# Patient Record
Sex: Male | Born: 1965 | Race: White | Hispanic: No | Marital: Married | Smoking: Former smoker
Health system: Southern US, Community
[De-identification: ages and names within clinical notes are randomized; demographics above are authoritative.]

## PROBLEM LIST (undated history)

## (undated) DIAGNOSIS — I10 Essential (primary) hypertension: Secondary | ICD-10-CM

## (undated) DIAGNOSIS — C801 Malignant (primary) neoplasm, unspecified: Secondary | ICD-10-CM

## (undated) DIAGNOSIS — E119 Type 2 diabetes mellitus without complications: Secondary | ICD-10-CM

## (undated) DIAGNOSIS — M1990 Unspecified osteoarthritis, unspecified site: Secondary | ICD-10-CM

## (undated) DIAGNOSIS — E785 Hyperlipidemia, unspecified: Secondary | ICD-10-CM

## (undated) DIAGNOSIS — M109 Gout, unspecified: Secondary | ICD-10-CM

## (undated) DIAGNOSIS — S82899A Other fracture of unspecified lower leg, initial encounter for closed fracture: Secondary | ICD-10-CM

## (undated) DIAGNOSIS — N189 Chronic kidney disease, unspecified: Secondary | ICD-10-CM

## (undated) DIAGNOSIS — I4891 Unspecified atrial fibrillation: Secondary | ICD-10-CM

## (undated) DIAGNOSIS — I499 Cardiac arrhythmia, unspecified: Secondary | ICD-10-CM

## (undated) HISTORY — DX: Gout, unspecified: M10.9

## (undated) HISTORY — DX: Essential (primary) hypertension: I10

## (undated) HISTORY — DX: Malignant (primary) neoplasm, unspecified: C80.1

## (undated) HISTORY — DX: Hyperlipidemia, unspecified: E78.5

## (undated) HISTORY — DX: Chronic kidney disease, unspecified: N18.9

## (undated) HISTORY — DX: Type 2 diabetes mellitus without complications: E11.9

## (undated) HISTORY — DX: Other fracture of unspecified lower leg, initial encounter for closed fracture: S82.899A

---

## 1994-02-11 HISTORY — PX: ANKLE SURGERY: SHX546

## 2006-03-19 ENCOUNTER — Emergency Department: Payer: Self-pay | Admitting: Emergency Medicine

## 2006-09-16 ENCOUNTER — Ambulatory Visit: Payer: Self-pay | Admitting: Internal Medicine

## 2009-08-06 ENCOUNTER — Emergency Department (HOSPITAL_COMMUNITY): Admission: EM | Admit: 2009-08-06 | Discharge: 2009-08-06 | Payer: Self-pay | Admitting: Emergency Medicine

## 2009-08-06 ENCOUNTER — Emergency Department: Payer: Self-pay | Admitting: Emergency Medicine

## 2011-10-16 ENCOUNTER — Ambulatory Visit: Payer: Self-pay | Admitting: Internal Medicine

## 2011-11-12 ENCOUNTER — Ambulatory Visit: Payer: Self-pay | Admitting: Internal Medicine

## 2012-01-17 ENCOUNTER — Telehealth: Payer: Self-pay

## 2013-01-27 NOTE — Telephone Encounter (Signed)
Error

## 2013-11-20 DIAGNOSIS — E782 Mixed hyperlipidemia: Secondary | ICD-10-CM | POA: Insufficient documentation

## 2013-11-20 DIAGNOSIS — IMO0002 Reserved for concepts with insufficient information to code with codable children: Secondary | ICD-10-CM | POA: Insufficient documentation

## 2013-11-20 DIAGNOSIS — I10 Essential (primary) hypertension: Secondary | ICD-10-CM | POA: Insufficient documentation

## 2013-11-20 DIAGNOSIS — M810 Age-related osteoporosis without current pathological fracture: Secondary | ICD-10-CM | POA: Insufficient documentation

## 2013-11-20 DIAGNOSIS — E119 Type 2 diabetes mellitus without complications: Secondary | ICD-10-CM | POA: Insufficient documentation

## 2014-02-16 ENCOUNTER — Ambulatory Visit: Payer: Self-pay | Admitting: Internal Medicine

## 2014-03-01 ENCOUNTER — Ambulatory Visit: Payer: Self-pay | Admitting: Internal Medicine

## 2014-03-11 ENCOUNTER — Ambulatory Visit: Payer: Self-pay | Admitting: Hematology and Oncology

## 2014-03-11 LAB — COMPREHENSIVE METABOLIC PANEL
Albumin: 3.7 g/dL (ref 3.4–5.0)
Alkaline Phosphatase: 113 U/L (ref 46–116)
Anion Gap: 8 (ref 7–16)
BUN: 9 mg/dL (ref 7–18)
Bilirubin,Total: 0.3 mg/dL (ref 0.2–1.0)
Calcium, Total: 9.2 mg/dL (ref 8.5–10.1)
Chloride: 103 mmol/L (ref 98–107)
Co2: 25 mmol/L (ref 21–32)
Creatinine: 0.99 mg/dL (ref 0.60–1.30)
EGFR (African American): >60
EGFR (Non-African Amer.): >60
Glucose: 259 mg/dL — ABNORMAL HIGH (ref 65–99)
Osmolality: 280 (ref 275–301)
Potassium: 4.2 mmol/L (ref 3.5–5.1)
SGOT(AST): 22 U/L (ref 15–37)
SGPT (ALT): 35 U/L (ref 14–63)
Sodium: 136 mmol/L (ref 136–145)
Total Protein: 7.6 g/dL (ref 6.4–8.2)

## 2014-03-11 LAB — CBC CANCER CENTER
Basophil #: 0.1 x10 3/mm (ref 0.0–0.1)
Basophil %: 0.9 %
Eosinophil #: 0.1 x10 3/mm (ref 0.0–0.7)
Eosinophil %: 2.3 %
HCT: 46 % (ref 40.0–52.0)
HGB: 15.6 g/dL (ref 13.0–18.0)
Lymphocyte #: 2 x10 3/mm (ref 1.0–3.6)
Lymphocyte %: 32.6 %
MCH: 30.8 pg (ref 26.0–34.0)
MCHC: 34 g/dL (ref 32.0–36.0)
MCV: 91 fL (ref 80–100)
Monocyte #: 0.6 x10 3/mm (ref 0.2–1.0)
Monocyte %: 9.3 %
Neutrophil #: 3.3 x10 3/mm (ref 1.4–6.5)
Neutrophil %: 54.9 %
Platelet: 267 x10 3/mm (ref 150–440)
RBC: 5.08 10*6/uL (ref 4.40–5.90)
RDW: 13.1 % (ref 11.5–14.5)
WBC: 6.1 x10 3/mm (ref 3.8–10.6)

## 2014-03-11 LAB — URIC ACID: Uric Acid: 5.1 mg/dL (ref 3.5–7.2)

## 2014-03-11 LAB — LACTATE DEHYDROGENASE: LDH: 147 U/L (ref 85–241)

## 2014-03-14 ENCOUNTER — Encounter: Payer: Self-pay | Admitting: General Surgery

## 2014-03-14 ENCOUNTER — Ambulatory Visit: Payer: Self-pay | Admitting: Hematology and Oncology

## 2014-03-14 ENCOUNTER — Ambulatory Visit: Payer: Self-pay | Admitting: Internal Medicine

## 2014-03-14 ENCOUNTER — Other Ambulatory Visit: Payer: Self-pay | Admitting: General Surgery

## 2014-03-14 ENCOUNTER — Ambulatory Visit (INDEPENDENT_AMBULATORY_CARE_PROVIDER_SITE_OTHER): Payer: 59 | Admitting: General Surgery

## 2014-03-14 VITALS — BP 142/70 | HR 82 | Resp 16 | Ht 74.0 in | Wt 384.0 lb

## 2014-03-14 DIAGNOSIS — R599 Enlarged lymph nodes, unspecified: Secondary | ICD-10-CM

## 2014-03-14 DIAGNOSIS — R590 Localized enlarged lymph nodes: Secondary | ICD-10-CM

## 2014-03-14 NOTE — Patient Instructions (Addendum)
Patient's surgery has been scheduled for 03-15-14 at [facility].

## 2014-03-14 NOTE — Progress Notes (Signed)
Patient ID: Colton Choi, male   DOB: 08-12-1965, 49 y.o.   MRN: [id number]  Chief Complaint  Patient presents with  . Other    swollen left inguinal lymphnode    HPI Colton Choi is a 49 y.o. male  Here for evaluation of swollen left inguinal lymph node. He recently had a left leg ultrasound on 02/17/14 due to right foot swelling, the swollen lymph node was found then. He had a CT Scan 03/01/14 of inguinal area. He reports no pain or discomfort.  He initially came to medical intention when he reported swelling on the dorsum of the distal left foot. This prompted the ultrasound and subsequent CT scan.  The patient has been seen by [name], M.D. and medical oncology and referred here for consideration of biopsy.  The patient is a Magazine features editor. He reports no unusual physical activity or injury.  In summer 2015 he had a 3 or 4 cm area of skin erythema and induration with an irregular rash that based on [name] M.D. thought was ringworm. This responded to topical therapy and has completely resolved with no residual nodularity in minimal skin scarring.  The patient is accompanied today by his sister was present for the interview and post exam consultation. HPI  Past Medical History  Diagnosis Date  . Hypertension   . Diabetes mellitus without complication   . Broken ankle 20 years ago    Right    No past surgical history on file.  No family history on file.  Social History History  Substance Use Topics  . Smoking status: Former Smoker -- 1.00 packs/day for 15 years    Types: Cigarettes    Quit date: 02/12/2012  . Smokeless tobacco: Never Used  . Alcohol Use: 0.0 oz/week    0 Not specified per week    Allergies  Allergen Reactions  . Sulfa Antibiotics     Current Outpatient Prescriptions  Medication Sig Dispense Refill  . amLODipine (NORVASC) 5 MG tablet Take 5 mg by mouth daily.    [name] aspirin 81 MG tablet Take 81 mg by mouth daily.    [name]  lisinopril-hydrochlorothiazide (PRINZIDE,ZESTORETIC) 20-12.5 MG per tablet Take 2 tablets by mouth daily.    . metFORMIN (GLUCOPHAGE) 500 MG tablet Take 500 mg by mouth 4 (four) times daily.    . pioglitazone (ACTOS) 30 MG tablet Take 30 mg by mouth daily.     No current facility-administered medications for this visit.    Review of Systems Review of Systems  Constitutional: Negative.   Respiratory: Negative.   Cardiovascular: Positive for leg swelling (left foot x 2 months). Negative for chest pain and palpitations.    Blood pressure 142/70, pulse 82, resp. rate 16, height 6\' 2"  (1.88 m), weight 384 lb (174.181 kg).  Physical Exam Physical Exam  Constitutional: He is oriented to person, place, and time. He appears well-developed and well-nourished.  Neck: Neck supple.  Cardiovascular: Normal rate, regular rhythm and normal heart sounds.   Pulses:      Femoral pulses are 2+ on the right side, and 2+ on the left side.      Dorsalis pedis pulses are 2+ on the right side, and 2+ on the left side.       Posterior tibial pulses are 2+ on the right side, and 2+ on the left side.  Pulmonary/Chest: Effort normal and breath sounds normal.  Musculoskeletal:       Legs:      Feet:  Lymphadenopathy:    He has no cervical adenopathy.    He has no axillary adenopathy.       Right: No inguinal adenopathy present.       Left: Inguinal adenopathy present.  Neurological: He is alert and oriented to person, place, and time.  Skin:     Inner triginous rash    Data Reviewed Abdominal ultrasound of 02/16/2014 as well as CT scan of the abdomen and pelvis dated 03/01/2014 were reviewed. No evidence of DVT. Small less than 1.5 Center lymph nodes in the right inguinal area, 3.9 cm lymph node in left inguinal area.  I spoke with Dr. Mike Gip by phone regarding biopsy options.  Assessment    Left inguinal lymphadenopathy, possibly related to summer time ringworm infection versus intertriginous  rash versus lymphoma.    Plan    The patient's sister is visiting from [place], [place], and it seems as to make use of her presence by doing an open biopsy tomorrow to establish a diagnosis.   Patient's surgery has been scheduled for 03-15-14 at [facility]. It is okay for patient to continue 81 mg aspirin.     Ref: Dr [name] PCP: Dr Governor Specking  [name] 03/14/2014, 1:40 PM

## 2014-03-15 ENCOUNTER — Ambulatory Visit: Payer: Self-pay | Admitting: General Surgery

## 2014-03-15 DIAGNOSIS — R590 Localized enlarged lymph nodes: Secondary | ICD-10-CM

## 2014-03-15 HISTORY — PX: OTHER SURGICAL HISTORY: SHX169

## 2014-03-16 ENCOUNTER — Encounter: Payer: Self-pay | Admitting: General Surgery

## 2014-03-17 ENCOUNTER — Encounter: Payer: Self-pay | Admitting: General Surgery

## 2014-03-22 ENCOUNTER — Ambulatory Visit (INDEPENDENT_AMBULATORY_CARE_PROVIDER_SITE_OTHER): Payer: Self-pay | Admitting: General Surgery

## 2014-03-22 ENCOUNTER — Encounter: Payer: Self-pay | Admitting: General Surgery

## 2014-03-22 VITALS — BP 152/82 | HR 88 | Resp 14 | Ht 74.0 in | Wt 381.0 lb

## 2014-03-22 DIAGNOSIS — R590 Localized enlarged lymph nodes: Secondary | ICD-10-CM

## 2014-03-22 DIAGNOSIS — R599 Enlarged lymph nodes, unspecified: Secondary | ICD-10-CM

## 2014-03-22 NOTE — Progress Notes (Signed)
Patient ID: Colton Choi, male   DOB: [birth year], 49 y.o.   MRN: [id number]  Chief Complaint  Patient presents with  . Routine Post Op     left groin lymphnode     HPI [name] is a 49 y.o. male here today for his post op left inguinal lymph node excision done on 03/15/14.Patient states he is doing well just still a little "sore". He reports mild numbness below the incision.  Here today with his sister.  HPI  Past Medical History  Diagnosis Date  . Hypertension   . Diabetes mellitus without complication   . Broken ankle 20 years ago    Right    Past Surgical History  Procedure Laterality Date  . Left grion excision  03/15/14    No family history on file.  Social History History  Substance Use Topics  . Smoking status: Former Smoker -- 1.00 packs/day for 15 years    Types: Cigarettes    Quit date: 02/12/2012  . Smokeless tobacco: Never Used  . Alcohol Use: 0.0 oz/week    0 Standard drinks or equivalent per week    Allergies  Allergen Reactions  . Sulfa Antibiotics     Current Outpatient Prescriptions  Medication Sig Dispense Refill  . amLODipine (NORVASC) 5 MG tablet Take 5 mg by mouth daily.    [name] aspirin 81 MG tablet Take 81 mg by mouth daily.    [name] ibuprofen (ADVIL,MOTRIN) 200 MG tablet Take 200 mg by mouth every 6 (six) hours as needed.    [name] lisinopril-hydrochlorothiazide (PRINZIDE,ZESTORETIC) 20-12.5 MG per tablet Take 2 tablets by mouth daily.    . metFORMIN (GLUCOPHAGE) 500 MG tablet Take 500 mg by mouth 4 (four) times daily.    . pioglitazone (ACTOS) 30 MG tablet Take 30 mg by mouth daily.     No current facility-administered medications for this visit.    Review of Systems Review of Systems  Constitutional: Negative.   Respiratory: Negative.   Cardiovascular: Negative.     Blood pressure 152/82, pulse 88, resp. rate 14, height 6\' 2"  (1.88 m), weight 381 lb (172.82 kg).  Physical Exam Physical Exam  Constitutional: He is oriented to person,  place, and time. He appears well-developed and well-nourished.  Lymphadenopathy:  Left groin incision healing well.  Neurological: He is alert and oriented to person, place, and time.  Skin: Skin is warm and dry.    Data Reviewed Flow cytometry was negative. Final pathology is pending.  Assessment    Doing well post lymph node excision.    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 We'll plan for return to work next week after his follow-up visit with Dr. Mike Gip. He will be contacted when pathology is available.     Follow up based on pathology.  May return to work Wednesday. Appointment to see Dr. Patsy Baltimore on Tuesday. May use heating pad as needed for comfort.  Patient is scheduled to see Dr Patsy Baltimore on Tuesday at 10:00 am.  PCP:  Frazier Richards Dr. Sunday Shams, Forest Gleason 03/23/2014, 4:10 PM

## 2014-03-22 NOTE — Patient Instructions (Addendum)
The patient is aware to call back for any questions or concerns. May use heating pad as needed for comfort 

## 2014-03-25 ENCOUNTER — Encounter: Payer: Self-pay | Admitting: General Surgery

## 2014-04-05 ENCOUNTER — Ambulatory Visit: Payer: Self-pay | Admitting: Hematology and Oncology

## 2014-04-07 ENCOUNTER — Ambulatory Visit: Payer: Self-pay | Admitting: Hematology and Oncology

## 2014-04-12 ENCOUNTER — Ambulatory Visit
Admit: 2014-04-12 | Disposition: A | Discharge status: Home / Self Care | Payer: Self-pay | Attending: Internal Medicine | Admitting: Internal Medicine

## 2014-04-12 ENCOUNTER — Ambulatory Visit
Admit: 2014-04-12 | Disposition: A | Discharge status: Home / Self Care | Payer: Self-pay | Attending: Hematology and Oncology | Admitting: Hematology and Oncology

## 2014-04-21 ENCOUNTER — Telehealth: Payer: Self-pay

## 2014-04-21 NOTE — Telephone Encounter (Addendum)
Pt came in to drop off  his AVS from his other doctor office for Dr Brigitte Pulse. 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 The forms were placed in nurses box at TL station. He can be reached @ [phone]. Thank you

## 2014-04-21 NOTE — Telephone Encounter (Signed)
Placed in Dr. Raul Del box.

## 2014-04-22 NOTE — Telephone Encounter (Signed)
Will have to review papers IA chart

## 2014-05-03 NOTE — Telephone Encounter (Signed)
I think this has already been addressed when pt presented to clinic with a copy of his PCPs note but please pull paper IA chart to confirm that it was documented that pt did NOT have OSA or need testing and that his card was reissued/lengthened.  Nothing scanned into Epic yet.

## 2014-05-03 NOTE — Telephone Encounter (Signed)
Note from PCP received - Dr. Caleen Essex III - stating that pt does not have sleep apnea and does not need a sleep study as he is asymptomatic regardless of his large neck size.

## 2014-05-04 DIAGNOSIS — C8100 Nodular lymphocyte predominant Hodgkin lymphoma, unspecified site: Secondary | ICD-10-CM | POA: Insufficient documentation

## 2014-05-13 ENCOUNTER — Ambulatory Visit
Admit: 2014-05-13 | Disposition: A | Discharge status: Home / Self Care | Payer: Self-pay | Attending: Internal Medicine | Admitting: Internal Medicine

## 2014-05-13 ENCOUNTER — Ambulatory Visit
Admit: 2014-05-13 | Disposition: A | Discharge status: Home / Self Care | Payer: Self-pay | Attending: Hematology and Oncology | Admitting: Hematology and Oncology

## 2014-06-06 ENCOUNTER — Other Ambulatory Visit: Payer: Self-pay | Admitting: *Deleted

## 2014-06-06 LAB — CBC CANCER CENTER
Basophil #: 0 x10 3/mm (ref 0.0–0.1)
Basophil %: 0.5 %
Eosinophil #: 0.3 x10 3/mm (ref 0.0–0.7)
Eosinophil %: 6.8 %
HCT: 41.9 % (ref 40.0–52.0)
HGB: 14.3 g/dL (ref 13.0–18.0)
Lymphocyte #: 0.5 x10 3/mm — ABNORMAL LOW (ref 1.0–3.6)
Lymphocyte %: 13.4 %
MCH: 31.2 pg (ref 26.0–34.0)
MCHC: 34.2 g/dL (ref 32.0–36.0)
MCV: 91 fL (ref 80–100)
Monocyte #: 0.4 x10 3/mm (ref 0.2–1.0)
Monocyte %: 10.4 %
Neutrophil #: 2.7 x10 3/mm (ref 1.4–6.5)
Neutrophil %: 68.9 %
Platelet: 171 x10 3/mm (ref 150–440)
RBC: 4.6 10*6/uL (ref 4.40–5.90)
RDW: 13.2 % (ref 11.5–14.5)
WBC: 4 x10 3/mm (ref 3.8–10.6)

## 2014-06-06 LAB — SURGICAL PATHOLOGY

## 2014-06-07 ENCOUNTER — Other Ambulatory Visit: Payer: Self-pay | Admitting: *Deleted

## 2014-06-10 ENCOUNTER — Ambulatory Visit: Admission: RE | Admit: 2014-06-10 | Payer: 59 | Source: Ambulatory Visit

## 2014-06-12 NOTE — Op Note (Signed)
PATIENT NAME:  Colton Choi, Colton Choi MR#:  [id number] DATE OF BIRTH:  1965/03/03  DATE OF PROCEDURE:  03/15/2014  PREOPERATIVE DIAGNOSIS: Left inguinal lymphadenopathy, possible lymphoma.   POSTOPERATIVE DIAGNOSIS: Left inguinal lymphadenopathy, possible lymphoma.   OPERATIVE PROCEDURE: Excision of left inguinal node.   SURGEON: [name], M.D.    ANESTHESIA: Attended local, 30 mL of 0.5% Xylocaine with 0.25% Marcaine with 1:200,000 units of epinephrine.   ESTIMATED BLOOD LOSS: Minimal.   CLINICAL NOTE: This 49 year old male was noted to have incidentally identified lymphadenopathy on a lower extremity ultrasound. Node biopsy has been requested by medical oncology.   OPERATIVE NOTE: With the patient under adequate sedation, the area was prepped with ChloraPrep. Extensive taping was used to hold the abdominal panniculus away from the surgical field. The area had been marked with ultrasound prior to prepping. Marcaine and Xylocaine were infiltrated for analgesia. A skin line incision just below the inguinal crease was carried down through the skin and subcutaneous tissue and hemostasis was achieved with electrocautery. The nodal tissue was identified and the vascular pedicles divided between 3-0 Vicryl ties. The node was sent fresh for histology. The wound was closed in layers with 2-0 Vicryl deep, 3-0 Vicryl to the superficial fascia and a running 4-0 Vicryl subcuticular suture for the skin. Benzoin, Steri-Strips, Telfa and Tegaderm dressing was then applied.   The patient tolerated the procedure well and was taken to the recovery room in stable condition.    ____________________________ [name], MD jwb:JT D: 03/16/2014 07:26:27 ET T: 03/16/2014 11:06:15 ET JOB#: [id number]  cc: [name], MD, <Dictator> Ocie Cornfield. Ouida Sills, MD Melissa C. Mike Gip, MD Pepe Mineau Amedeo Kinsman MD ELECTRONICALLY SIGNED 03/16/2014 16:08

## 2014-06-12 NOTE — Consult Note (Signed)
Reason for Visit: This 49 year old Male patient presents to the clinic for initial evaluation of  Hodgkin's lymphoma .   Referred by Dr. Mike Gip.  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  Pathology Report pathology report reviewed   Imaging Report PET CT scan reviewed   Referral Report clinical notes reviewed   Planned Treatment Regimen upfront chemotherapy with involved field radiation   HPI   patient's a pleasant 49 year old male who presented with subtle left lower extremity swelling. He was noted to have left inguinal adenopathy confirmed on CT scanand was seen by surgeon who underwent biopsy which was positive for nodular lymphocyte predominant Hodgkin's lymphoma. PET CT scan was performed showing hypermetabolic enlarged pelvic lymph nodes in the region of the left external iliac node as well as right pelvic sidewall.patient has no fever chills or night sweats. Does have some right upper chest itching of the skin at times. Patient is morbidly obese specifically denies weight loss over time. He is seen today for consideration of treatment.  Past Hx:    Gout:    Chronic kidney disease (stage II):    Diabetes:    Diabetes - Borderline:    Hypertension:    Hyperlipidemia:    HTN:    Bone marrow aspirate and biopsy: 08-Apr-2014   Left inguinal lymph node biopsy: 15-Mar-2014   Ankle Surgery  following fracture: 1996  Past, Family and Social History:  Past Medical History positive   Cardiovascular hyperlipidemia; hypertension   Genitourinary stage II chronic renal disease   Endocrine diabetes mellitus   Past Surgical History right ankle surgery with pinning   Past Medical History Comments gout   Family History positive   Family History Comments mother died of cardiac vascular  heart disease had breast cancer at age 75 grandmother with cervical cancer   Social History positive   Social History Comments 15-pack-year smoking history quit smoking 2 years prior no EtOH abuse history   Additional Past Medical and Surgical History seen by himself today   Allergies:   Sulfa drugs: Unknown  Home Meds:  Home Medications: Medication Instructions Status  hydrochlorothiazide-lisinopril 12.5 mg-20 mg oral tablet 2 tab(s) orally once a day Active  pioglitazone 30 mg oral tablet 1 tab(s) orally once a day Active  metFORMIN 500 mg oral tablet 4  orally once a day Active  aspirin 81 mg oral tablet 1 tab(s) orally once a day Active  amLODIPine 10 mg oral tablet 1 tab(s) orally once a day (in the morning) Active  glipiZIDE 2.5 mg oral tablet, extended release 1 tab(s) orally once a day Active   Review of Systems:  General negative   Performance Status (ECOG) 0   Skin negative   Breast negative   Ophthalmologic negative   ENMT negative   Respiratory and Thorax negative   Cardiovascular negative   Gastrointestinal negative   Genitourinary negative   Musculoskeletal negative   Neurological negative   Psychiatric negative   Hematology/Lymphatics negative   Endocrine negative   Allergic/Immunologic negative   Review of Systems   except for the left lower extremity edemadenies any weight loss, fatigue, weakness, fever, chills or night sweats. Patient denies any loss of vision, blurred vision. Patient denies any ringing  of the ears or hearing loss. No irregular heartbeat. Patient denies heart murmur or history of fainting. Patient denies  any chest pain or pain radiating to her upper extremities. Patient denies any shortness of breath, difficulty breathing at night, cough or hemoptysis. Patient denies any swelling in the lower legs. Patient denies any nausea vomiting, vomiting of blood, or coffee ground material in the vomitus. Patient denies any stomach pain.  Patient states has had normal bowel movements no significant constipation or diarrhea. Patient denies any dysuria, hematuria or significant nocturia. Patient denies any problems walking, swelling in the joints or loss of balance. Patient denies any skin changes, loss of hair or loss of weight. Patient denies any excessive worrying or anxiety or significant depression. Patient denies any problems with insomnia. Patient denies excessive thirst, polyuria, polydipsia. Patient denies any swollen glands, patient denies easy bruising or easy bleeding. Patient denies any recent infections, allergies or URI. Patient "s visual fields have not changed significantly in recent time.   Nursing Notes:  Nursing Vital Signs and Chemo Nursing Nursing Notes: *CC Vital Signs Flowsheet:   09-Mar-16 09:51  Temp Temperature 98.5  Pulse Pulse 80  Respirations Respirations 20  SBP SBP 153  DBP DBP 81  Pain Scale (0-10)  0  Current Weight (kg) (kg) 179.2  Height (cm) centimeters 188.5  BSA (m2) 2.9   Physical Exam:  General/Skin/HEENT:  General normal   Skin normal   Eyes normal   ENMT normal   Head and Neck normal   Additional PE well-developed obese male in NAD. No cervical supraclavicular axillary or inguinal adenopathy is appreciated. He is status post inguinal biopsy which site is healing well. Lungs are clear to A&P cardiac examination shows regular rate and rhythm. Abdomen is obese. He still has some prominent left lower extremity edema.   Breasts/Resp/CV/GI/GU:  Respiratory and Thorax normal   Cardiovascular normal   Gastrointestinal normal   Genitourinary normal   MS/Neuro/Psych/Lymph:  Musculoskeletal normal   Neurological normal   Lymphatics normal   Other Results:  Radiology Results: CT:    19-Jan-16 08:30, CT Abdomen and Pelvis With Contrast  CT Abdomen and Pelvis With Contrast   REASON FOR EXAM:    LABS FIRST Enlarged Lymph Node Groin Area Diabetic   Metformin  COMMENTS:        PROCEDURE: KCT - KCT ABDOMEN/PELVIS W  - Mar 01 2014  8:30AM     CLINICAL DATA:  Left foot swelling since tenth/2015. Diabetes.  Enlarged groin lymph node.    EXAM:  CT ABDOMEN AND PELVIS WITH CONTRAST    TECHNIQUE:  Multidetector CT imaging of the abdomen and pelvis was performed  using the standard protocol following bolus administration of  intravenous contrast.    CONTRAST:  125 cc Omnipaque 350    COMPARISON:  Report from 01/24/2000    FINDINGS:  Lower chest: Mild scarring or subsegmental atelectasis in the  lingula.    Hepatobiliary: Diffuse hepatic steatosis.    Pancreas: Unremarkable    Spleen: Unremarkable  Adrenals/Urinary Tract: Unremarkable    Stomach/Bowel: Unremarkable    Vascular/Lymphatic: Aortoiliac atherosclerotic vascular disease.  Right external iliac lymph node 1.5 cm in short axis, image 80  series 9. Right external iliac lymph node 1.3 cm in short axis,  image 85 series 9. Left external iliac lymph node 1.9 cm in short  axis, image 77 series 9, with a small fatty hilum. Left inguinal  lymph node 1.5 cm in short axis, image 93 series 9. No pathologic  adenopathy in the mesentery, porta hepatis, or para-aortic  positions.    Reproductive: Unremarkable  Other: No supplemental non-categorized findings.    Musculoskeletal: Small umbilical hernia contains adipose tissue.  Degenerative spurring of both femoral heads.     IMPRESSION:  1. Bilateral external iliac and left inguinal adenopathy, mild to  moderate in degree. CT does not provided the specific cause for this  adenopathy, and neoplastic an reactive causes are both possible.  Left inguinal lymph node biopsy should be straightforward, if  clinically indicated, for example if the adenopathy persists.  2. Diffuse hepatic steatosis.  3.  Aortoiliac atherosclerotic vascular disease.  4. Small umbilical hernia contains adipose tissue.  5. Degenerative spurring of both femoral  heads.  Electronically Signed  By: Sherryl Barters M.D.    On: 03/01/2014 09:16         Verified By: Carron Curie, M.D.,  Nuclear Med:    [phone] 08:48, PET/CT Scan Lymphoma Initial Staging  PET/CT Scan Lymphoma Initial Staging   REASON FOR EXAM:    Staging Hodgkins  COMMENTS:       PROCEDURE: PET - PET/CT Eduardo Osier LYMPHOMA  - Apr 05 2014  8:48AM     CLINICAL DATA:  Initial treatment strategy for non-Hodgkin's  lymphoma.    EXAM:  NUCLEAR MEDICINE PET SKULL BASE TO THIGH    TECHNIQUE:  12.2 mCi F-18 FDG was injected intravenously. Full-ring PET imaging  was performed from the skull base to thigh after the radiotracer. CT  data was obtained and used for attenuation correction and anatomic  localization.    FASTING BLOOD GLUCOSE:  Value: 173 mg/dl    COMPARISON:  CT scan abdomen/ pelvis 03/01/2014    FINDINGS:  Examination is limited by morbid obesity. Moderate misregistration  and artifact.    NECK    No hypermetabolic lymph nodes in the neck.    CHEST  No hypermetabolic mediastinal or hilar nodes. No suspicious  pulmonary nodules on the CT scan.    ABDOMEN/PELVIS    No abnormal hypermetabolic activity within the liver, pancreas,  adrenal glands, or spleen. The enlarged pelvic lymph nodes. are  hypermetabolic. The right pelvic sidewall node has SUV max of 4.6.  The left external iliac node has an SUV max of 4.3. The enlarged  left inguinal lymph node has been surgically removed (excisional  biopsy) No enlarged or hypermetabolic abdominal lymph nodes.    SKELETON    No focal hypermetabolic activity to suggest skeletal metastasis.   IMPRESSION:  Limited examination due to morbid obesity.    Hypermetabolic pelvic lymph nodes as discussed above. No adenopathy  in the neck, axilla, chest or abdomen.      Electronically Signed    By: Marijo Sanes M.D.    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 I believe the patient would benefit from systemic chemotherapy. Patient also may benefit from involved field radiation after completion of chemotherapy to his inguinal and pelvic lymph nodes. Case will be presented at our weekly tumor conference. I have discussed the case personally with medical oncology. I will also set a follow-up appointment with medical oncology for next week.  I would like to take this opportunity for allowing me to participate in the care of your patient..  Fax to Physician:  Physicians To Recieve Fax: Kirk Ruths, MD - [phone].  Electronic Signatures: Armstead Peaks (MD)  (Signed 09-Mar-16 11:45)  Authored: HPI, Diagnosis, Past Hx, PFSH, Allergies, Home Meds, ROS, Nursing Notes, Physical Exam, Other Results, Relevent Results, Encounter Assessment and Plan, Fax to Physician   Last Updated: 09-Mar-16 11:45 by Armstead Peaks (MD)

## 2014-06-13 ENCOUNTER — Ambulatory Visit
Admission: RE | Admit: 2014-06-13 | Discharge: 2014-06-13 | Disposition: A | Discharge status: Home / Self Care | Payer: 59 | Source: Ambulatory Visit | Attending: Radiation Oncology | Admitting: Radiation Oncology

## 2014-06-13 ENCOUNTER — Ambulatory Visit: Payer: 59 | Admitting: Radiation Oncology

## 2014-06-13 ENCOUNTER — Other Ambulatory Visit: Payer: Self-pay

## 2014-06-13 DIAGNOSIS — C8105 Nodular lymphocyte predominant Hodgkin lymphoma, lymph nodes of inguinal region and lower limb: Secondary | ICD-10-CM | POA: Insufficient documentation

## 2014-06-13 DIAGNOSIS — Z510 Encounter for antineoplastic radiation therapy: Secondary | ICD-10-CM | POA: Diagnosis not present

## 2014-06-14 ENCOUNTER — Ambulatory Visit
Admission: RE | Admit: 2014-06-14 | Discharge: 2014-06-14 | Disposition: A | Discharge status: Home / Self Care | Payer: 59 | Source: Ambulatory Visit | Attending: Radiation Oncology | Admitting: Radiation Oncology

## 2014-06-14 DIAGNOSIS — C8105 Nodular lymphocyte predominant Hodgkin lymphoma, lymph nodes of inguinal region and lower limb: Secondary | ICD-10-CM | POA: Diagnosis not present

## 2014-06-15 ENCOUNTER — Ambulatory Visit
Admission: RE | Admit: 2014-06-15 | Discharge: 2014-06-15 | Disposition: A | Discharge status: Home / Self Care | Payer: 59 | Source: Ambulatory Visit | Attending: Radiation Oncology | Admitting: Radiation Oncology

## 2014-06-15 DIAGNOSIS — C8105 Nodular lymphocyte predominant Hodgkin lymphoma, lymph nodes of inguinal region and lower limb: Secondary | ICD-10-CM | POA: Diagnosis not present

## 2014-06-16 ENCOUNTER — Ambulatory Visit
Admission: RE | Admit: 2014-06-16 | Discharge: 2014-06-16 | Disposition: A | Discharge status: Home / Self Care | Payer: 59 | Source: Ambulatory Visit | Attending: Radiation Oncology | Admitting: Radiation Oncology

## 2014-06-16 DIAGNOSIS — C8105 Nodular lymphocyte predominant Hodgkin lymphoma, lymph nodes of inguinal region and lower limb: Secondary | ICD-10-CM | POA: Diagnosis not present

## 2014-06-17 ENCOUNTER — Ambulatory Visit
Admission: RE | Admit: 2014-06-17 | Discharge: 2014-06-17 | Disposition: A | Discharge status: Home / Self Care | Payer: 59 | Source: Ambulatory Visit | Attending: Radiation Oncology | Admitting: Radiation Oncology

## 2014-06-17 ENCOUNTER — Other Ambulatory Visit: Payer: Self-pay | Admitting: *Deleted

## 2014-06-17 DIAGNOSIS — C8105 Nodular lymphocyte predominant Hodgkin lymphoma, lymph nodes of inguinal region and lower limb: Secondary | ICD-10-CM | POA: Diagnosis not present

## 2014-06-17 DIAGNOSIS — C8190 Hodgkin lymphoma, unspecified, unspecified site: Secondary | ICD-10-CM

## 2014-06-17 MED ORDER — SILVER SULFADIAZINE 1 % EX CREA: 1.0000 "application " | TOPICAL_CREAM | Freq: Two times a day (BID) | CUTANEOUS | Status: DC

## 2014-06-17 MED ORDER — SILVER SULFADIAZINE 1 % EX CREA: TOPICAL_CREAM | Freq: Two times a day (BID) | CUTANEOUS | Status: DC

## 2014-06-20 ENCOUNTER — Ambulatory Visit
Admission: RE | Admit: 2014-06-20 | Discharge: 2014-06-20 | Disposition: A | Discharge status: Home / Self Care | Payer: 59 | Source: Ambulatory Visit | Attending: Radiation Oncology | Admitting: Radiation Oncology

## 2014-06-20 DIAGNOSIS — C8105 Nodular lymphocyte predominant Hodgkin lymphoma, lymph nodes of inguinal region and lower limb: Secondary | ICD-10-CM | POA: Diagnosis not present

## 2014-07-12 ENCOUNTER — Telehealth: Payer: Self-pay

## 2014-07-21 ENCOUNTER — Other Ambulatory Visit: Payer: Self-pay | Admitting: *Deleted

## 2014-07-21 ENCOUNTER — Encounter: Payer: Self-pay | Admitting: Radiation Oncology

## 2014-07-21 ENCOUNTER — Ambulatory Visit
Admission: RE | Admit: 2014-07-21 | Discharge: 2014-07-21 | Disposition: A | Discharge status: Home / Self Care | Payer: 59 | Source: Ambulatory Visit | Attending: Radiation Oncology | Admitting: Radiation Oncology

## 2014-07-21 VITALS — BP 142/74 | HR 70 | Temp 96.6°F | Resp 20 | Wt >= 6400 oz

## 2014-07-21 DIAGNOSIS — C8190 Hodgkin lymphoma, unspecified, unspecified site: Secondary | ICD-10-CM

## 2014-07-21 DIAGNOSIS — C8590 Non-Hodgkin lymphoma, unspecified, unspecified site: Secondary | ICD-10-CM

## 2014-07-21 NOTE — Progress Notes (Signed)
Radiation Oncology Follow up Note  Name: Colton Choi   Date:   07/21/2014 MRN:  [id number] DOB: 09-12-1965    This 49 y.o. male presents to the clinic today for follow-up of stage IIa lymphocyte predominant Hodgkin's lymphoma status post external beam radiation therapy to his pelvis.  REFERRING PROVIDER: Kirk Ruths, MD  HPI: Patient is a 49 year old male now one month out having completed external beam radiation therapy to his pelvis for stage IIa lymphocyte predominant Hodgkin's disease. He presented with a left inguinal adenopathy biopsy-positive for Hodgkin's lymphoma lymphocyte predominant. PET/CT done demonstrated hypermetabolic enlarged pelvic nodes in the external iliac as well as right pelvic sidewall. He was seen for second opinion at [facility] and recommendation was made for radiation therapy alone.[name] He is now seen in routine follow-up and is doing well he specifically denies diarrhea dysuria or any other GI/GU complaints. The swelling in his left lower extremity which was a presenting sign has improved. He is ambulating well.  COMPLICATIONS OF TREATMENT: none  FOLLOW UP COMPLIANCE: keeps appointments   PHYSICAL EXAM:  BP 142/74 mmHg  Pulse 70  Temp(Src) 96.6 F (35.9 C)  Resp 20  Wt 404 lb 5.2 oz (183.4 kg) Well-developed obese male in NAD. No peripheral adenopathy is noted in the cervical supraclavicular axillary or inguinal regions. Still has some slight lymphedema of his left lower extremity although this is improved. Well-developed well-nourished patient in NAD. HEENT reveals PERLA, EOMI, discs not visualized.  Oral cavity is clear. No oral mucosal lesions are identified. Neck is clear without evidence of cervical or supraclavicular adenopathy. Lungs are clear to A&P. Cardiac examination is essentially unremarkable with regular rate and rhythm without murmur rub or thrill. Abdomen is benign with no organomegaly or masses noted. Motor sensory and DTR levels are  equal and symmetric in the upper and lower extremities. Cranial nerves II through XII are grossly intact. Proprioception is intact. No peripheral adenopathy or edema is identified. No motor or sensory levels are noted. Crude visual fields are within normal range.   RADIOLOGY RESULTS: Follow-up PET CT scan has been ordered  PLAN: At the present time I've ordered a follow-up PET CT scan in about a month's time and is scheduled a follow-up appointment with medical oncology. We'll review the PET CT when it becomes available. He otherwise is doing well. I've asked to see him back in 4-5 months for follow-up. He knows to call sooner with any concerns.  I would like to take this opportunity for allowing me to participate in the care of your patient.Armstead Peaks., MD

## 2014-08-22 ENCOUNTER — Ambulatory Visit
Admission: RE | Admit: 2014-08-22 | Discharge: 2014-08-22 | Disposition: A | Discharge status: Home / Self Care | Payer: 59 | Source: Ambulatory Visit | Attending: Radiation Oncology | Admitting: Radiation Oncology

## 2014-08-22 DIAGNOSIS — C8190 Hodgkin lymphoma, unspecified, unspecified site: Secondary | ICD-10-CM

## 2014-08-22 LAB — GLUCOSE, CAPILLARY: GLUCOSE-CAPILLARY: 107 mg/dL — AB (ref 65–99)

## 2014-08-22 MED ORDER — FLUDEOXYGLUCOSE F - 18 (FDG) INJECTION
12.6000 | Freq: Once | INTRAVENOUS | Status: AC | PRN
  Administered 2014-08-22: 12.6 via INTRAVENOUS

## 2014-08-25 ENCOUNTER — Other Ambulatory Visit: Payer: Self-pay

## 2014-08-25 DIAGNOSIS — R590 Localized enlarged lymph nodes: Secondary | ICD-10-CM

## 2014-08-29 ENCOUNTER — Ambulatory Visit: Payer: 59 | Admitting: Hematology and Oncology

## 2014-08-30 ENCOUNTER — Inpatient Hospital Stay: Payer: 59 | Attending: Hematology and Oncology | Admitting: Hematology and Oncology

## 2014-09-11 ENCOUNTER — Encounter: Payer: Self-pay | Admitting: Hematology and Oncology

## 2014-09-13 ENCOUNTER — Encounter: Payer: Self-pay | Admitting: Hematology and Oncology

## 2014-09-22 ENCOUNTER — Ambulatory Visit: Payer: 59 | Admitting: Hematology and Oncology

## 2014-10-10 ENCOUNTER — Telehealth: Payer: Self-pay | Admitting: *Deleted

## 2014-10-10 ENCOUNTER — Other Ambulatory Visit: Payer: Self-pay

## 2014-10-10 ENCOUNTER — Ambulatory Visit
Admission: RE | Admit: 2014-10-10 | Discharge: 2014-10-10 | Disposition: A | Discharge status: Home / Self Care | Payer: 59 | Source: Ambulatory Visit | Attending: Hematology and Oncology | Admitting: Hematology and Oncology

## 2014-10-10 ENCOUNTER — Inpatient Hospital Stay: Payer: 59 | Attending: Hematology and Oncology | Admitting: Hematology and Oncology

## 2014-10-10 ENCOUNTER — Telehealth: Payer: Self-pay

## 2014-10-10 ENCOUNTER — Inpatient Hospital Stay: Payer: 59

## 2014-10-10 ENCOUNTER — Other Ambulatory Visit: Payer: Self-pay | Admitting: *Deleted

## 2014-10-10 VITALS — BP 135/78 | HR 70 | Temp 97.8°F | Resp 16 | Wt >= 6400 oz

## 2014-10-10 DIAGNOSIS — I10 Essential (primary) hypertension: Secondary | ICD-10-CM | POA: Diagnosis not present

## 2014-10-10 DIAGNOSIS — R590 Localized enlarged lymph nodes: Secondary | ICD-10-CM

## 2014-10-10 DIAGNOSIS — C8100 Nodular lymphocyte predominant Hodgkin lymphoma, unspecified site: Secondary | ICD-10-CM | POA: Insufficient documentation

## 2014-10-10 DIAGNOSIS — M7989 Other specified soft tissue disorders: Secondary | ICD-10-CM | POA: Insufficient documentation

## 2014-10-10 DIAGNOSIS — Z87891 Personal history of nicotine dependence: Secondary | ICD-10-CM | POA: Diagnosis not present

## 2014-10-10 DIAGNOSIS — R197 Diarrhea, unspecified: Secondary | ICD-10-CM | POA: Insufficient documentation

## 2014-10-10 DIAGNOSIS — Z7982 Long term (current) use of aspirin: Secondary | ICD-10-CM | POA: Diagnosis not present

## 2014-10-10 DIAGNOSIS — Z79899 Other long term (current) drug therapy: Secondary | ICD-10-CM | POA: Insufficient documentation

## 2014-10-10 DIAGNOSIS — M25552 Pain in left hip: Secondary | ICD-10-CM | POA: Insufficient documentation

## 2014-10-10 DIAGNOSIS — E119 Type 2 diabetes mellitus without complications: Secondary | ICD-10-CM | POA: Diagnosis not present

## 2014-10-10 DIAGNOSIS — Z8639 Personal history of other endocrine, nutritional and metabolic disease: Secondary | ICD-10-CM | POA: Diagnosis not present

## 2014-10-10 DIAGNOSIS — N189 Chronic kidney disease, unspecified: Secondary | ICD-10-CM | POA: Insufficient documentation

## 2014-10-10 DIAGNOSIS — I129 Hypertensive chronic kidney disease with stage 1 through stage 4 chronic kidney disease, or unspecified chronic kidney disease: Secondary | ICD-10-CM | POA: Insufficient documentation

## 2014-10-10 DIAGNOSIS — E785 Hyperlipidemia, unspecified: Secondary | ICD-10-CM | POA: Diagnosis not present

## 2014-10-10 DIAGNOSIS — Z8781 Personal history of (healed) traumatic fracture: Secondary | ICD-10-CM | POA: Insufficient documentation

## 2014-10-10 DIAGNOSIS — Z923 Personal history of irradiation: Secondary | ICD-10-CM | POA: Insufficient documentation

## 2014-10-10 DIAGNOSIS — R5383 Other fatigue: Secondary | ICD-10-CM | POA: Diagnosis not present

## 2014-10-10 DIAGNOSIS — M25559 Pain in unspecified hip: Secondary | ICD-10-CM | POA: Insufficient documentation

## 2014-10-10 LAB — COMPREHENSIVE METABOLIC PANEL
ALT: 16 U/L — ABNORMAL LOW (ref 17–63)
AST: 21 U/L (ref 15–41)
Albumin: 3.5 g/dL (ref 3.5–5.0)
Alkaline Phosphatase: 72 U/L (ref 38–126)
Anion gap: 8 (ref 5–15)
BUN: 10 mg/dL (ref 6–20)
CO2: 27 mmol/L (ref 22–32)
Calcium: 8.3 mg/dL — ABNORMAL LOW (ref 8.9–10.3)
Chloride: 104 mmol/L (ref 101–111)
Creatinine, Ser: 0.78 mg/dL (ref 0.61–1.24)
GFR calc Af Amer: >60 mL/min (ref >60)
GFR calc non Af Amer: >60 mL/min (ref >60)
Glucose, Bld: 117 mg/dL — ABNORMAL HIGH (ref 65–99)
Potassium: 4.1 mmol/L (ref 3.5–5.1)
Sodium: 139 mmol/L (ref 135–145)
Total Bilirubin: 0.4 mg/dL (ref 0.3–1.2)
Total Protein: 7.1 g/dL (ref 6.5–8.1)

## 2014-10-10 LAB — CBC WITH DIFFERENTIAL/PLATELET
Basophils Absolute: 0 10*3/uL (ref 0–0.1)
Basophils Relative: 1 %
Eosinophils Absolute: 0.1 10*3/uL (ref 0–0.7)
Eosinophils Relative: 3 %
HCT: 39.7 % — ABNORMAL LOW (ref 40.0–52.0)
Hemoglobin: 13.8 g/dL (ref 13.0–18.0)
Lymphocytes Relative: 15 %
Lymphs Abs: 0.5 10*3/uL — ABNORMAL LOW (ref 1.0–3.6)
MCH: 31.7 pg (ref 26.0–34.0)
MCHC: 34.9 g/dL (ref 32.0–36.0)
MCV: 90.9 fL (ref 80.0–100.0)
Monocytes Absolute: 0.6 10*3/uL (ref 0.2–1.0)
Monocytes Relative: 16 %
Neutro Abs: 2.4 10*3/uL (ref 1.4–6.5)
Neutrophils Relative %: 65 %
Platelets: 227 10*3/uL (ref 150–440)
RBC: 4.37 MIL/uL — ABNORMAL LOW (ref 4.40–5.90)
RDW: 13.5 % (ref 11.5–14.5)
WBC: 3.6 10*3/uL — ABNORMAL LOW (ref 3.8–10.6)

## 2014-10-10 LAB — LACTATE DEHYDROGENASE: LDH: 129 U/L (ref 98–192)

## 2014-10-10 LAB — FERRITIN: Ferritin: 245 ng/mL (ref 24–336)

## 2014-10-10 LAB — SEDIMENTATION RATE: Sed Rate: 43 mm/hr — ABNORMAL HIGH (ref 0–15)

## 2014-10-10 LAB — URIC ACID: Uric Acid, Serum: 7.1 mg/dL (ref 4.4–7.6)

## 2014-10-10 NOTE — Progress Notes (Signed)
Pt c/o fatigue, insomnia, and lethargy, along with swelling in his left leg. Also notes diarrhea, (with white "tic tac" shaped balls) since radiation; and that he has been taking 3 to 9 Motrin tablets daily for right lower back and left leg pain.Marland KitchenMarland Kitchen

## 2014-10-10 NOTE — Progress Notes (Signed)
[facility] day:  10/10/2014  Chief Complaint: Colton Choi is a 49 y.o. 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  HPI: The patient was last seen in the medical oncology clinic on 04/25/2014.  At that time, he was seen for reassessment after interval radiation oncology consult.  Given the controversy regardng radiation or chemotherapy, he sought second opinion at [facility].  Decision was made to proceed with radiation.  He received 36 Gy from 05/24/2014 until 06/20/2014 to the whole pelvis.  He tolerated the radiation well.  He had some resolution of the left lower extremity edema.  He had some mild intermittent diarrhea which was well controlled with imodium.  PET scan on 08/22/2014 revealed no evidence of active lymphoma.  Symptomatically, he notes constant fatigue. His left hip began hurting awhile after radiation. He he had some skin erythema post radiation. He notes recurrence of the left lower extremity edema associated with decreased mobility. He has trouble shifting gears on the truck.  He denies any adenopathy. He has had diarrhea since radiation.  Past Medical History  Diagnosis Date  . Hypertension   . Diabetes mellitus without complication   . Broken ankle 20 years ago    Right  . Gout   . Chronic kidney disease   . Hyperlipidemia   . Cancer     lymphoma    Past Surgical History  Procedure Laterality Date  . Left grion excision  03/15/14  . Ankle surgery  1996    No family history on file.  Social History:  reports that he quit smoking about 2 years ago. His smoking use included Cigarettes. He has a 15 pack-year smoking history. He has never used smokeless tobacco. He reports that he drinks alcohol. He reports that he does not use illicit drugs.  The patient is accompanied by his wife today.  Allergies:  Allergies   Allergen Reactions  . Sulfa Antibiotics     Current Medications: Current Outpatient Prescriptions  Medication Sig Dispense Refill  . amLODipine (NORVASC) 10 MG tablet Take by mouth.    [name] aspirin 81 MG tablet Take 81 mg by mouth daily.    [name] glimepiride (AMARYL) 2 MG tablet Take by mouth.    [name] glipiZIDE (GLUCOTROL XL) 2.5 MG 24 hr tablet Take by mouth.    [name] ibuprofen (ADVIL,MOTRIN) 200 MG tablet Take 200 mg by mouth every 6 (six) hours as needed.    [name] lisinopril-hydrochlorothiazide (PRINZIDE,ZESTORETIC) 20-12.5 MG per tablet Take 2 tablets by mouth daily.    . metFORMIN (GLUCOPHAGE) 500 MG tablet Take 500 mg by mouth 4 (four) times daily.    . pioglitazone (ACTOS) 30 MG tablet Take by mouth.    . silver sulfADIAZINE (SILVADENE) 1 % cream Apply 1 application topically 2 (two) times daily. (Patient not taking: Reported on 07/21/2014) 50 g 1   No current facility-administered medications for this visit.    Review of Systems:  GENERAL:  Feels tired.  No fevers, sweats or weight loss. PERFORMANCE STATUS (ECOG):  0-1 HEENT:  No visual changes, runny nose, sore throat, mouth sores or tenderness. Lungs: No shortness of breath or cough.  No hemoptysis. Cardiac:  No chest pain, palpitations, orthopnea, or PND. GI:  Diarrhea since radiation.  No nausea, vomiting, constipation, melena or hematochezia. GU:  No urgency, frequency, dysuria, or hematuria. Musculoskeletal:  No back pain.  Hip pain.  No muscle tenderness. Extremities:  Left lower extremity swelling.  No pain. Skin:  No rashes or skin changes. Neuro:  No headache, numbness or weakness, balance or coordination issues. Endocrine:  No diabetes, thyroid issues, hot flashes or night sweats. Psych:  No mood changes, depression or anxiety. Pain:  No focal pain. Review of systems:  All other systems reviewed and found to be negative.  Physical Exam: Blood pressure 135/78, pulse 70, temperature 97.8 F (36.6 C), temperature source Tympanic,  resp. rate 16, weight 405 lb 13.9 oz (184.101 kg). GENERAL:  Well developed, well nourished, sitting comfortably in the exam room in no acute distress. MENTAL STATUS:  Alert and oriented to person, place and time. HEAD:  Wearing a cap.  Goatee.  Normocephalic, atraumatic, face symmetric, no Cushingoid features. EYES:  Glasses.  Brown eyes.  Pupils equal round and reactive to light and accomodation.  No conjunctivitis or scleral icterus. ENT:  Oropharynx clear without lesion.  Tongue normal. Mucous membranes moist.  RESPIRATORY:  Clear to auscultation without rales, wheezes or rhonchi. CARDIOVASCULAR:  Regular rate and rhythm without murmur, rub or gallop. ABDOMEN:  Soft, non-tender, with active bowel sounds, and no hepatosplenomegaly.  No masses. SKIN:  No rashes, ulcers or lesions. EXTREMITIES: Left lower extremity edema.  No skin discoloration or tenderness.  No palpable cords. LYMPH NODES: No palpable cervical, supraclavicular, axillary or inguinal adenopathy  NEUROLOGICAL: Unremarkable. PSYCH:  Appropriate.  Appointment on 10/10/2014  Component Date Value Ref Range Status  . WBC 10/10/2014 3.6* 3.8 - 10.6 K/uL Final  . RBC 10/10/2014 4.37* 4.40 - 5.90 MIL/uL Final  . Hemoglobin 10/10/2014 13.8  13.0 - 18.0 g/dL Final  . HCT 10/10/2014 39.7* 40.0 - 52.0 % Final  . MCV 10/10/2014 90.9  80.0 - 100.0 fL Final  . MCH 10/10/2014 31.7  26.0 - 34.0 pg Final  . MCHC 10/10/2014 34.9  32.0 - 36.0 g/dL Final  . RDW 10/10/2014 13.5  11.5 - 14.5 % Final  . Platelets 10/10/2014 227  150 - 440 K/uL Final  . Neutrophils Relative % 10/10/2014 65   Final  . Neutro Abs 10/10/2014 2.4  1.4 - 6.5 K/uL Final  . Lymphocytes Relative 10/10/2014 15   Final  . Lymphs Abs 10/10/2014 0.5* 1.0 - 3.6 K/uL Final  . Monocytes Relative 10/10/2014 16   Final  . Monocytes Absolute 10/10/2014 0.6  0.2 - 1.0 K/uL Final  . Eosinophils Relative 10/10/2014 3   Final  . Eosinophils Absolute 10/10/2014 0.1  0 - 0.7 K/uL  Final  . Basophils Relative 10/10/2014 1   Final  . Basophils Absolute 10/10/2014 0.0  0 - 0.1 K/uL Final    Assessment:  Colton Choi is a 49 y.o. 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  He presented a 2-3 month history of intermittent left lower extremity pedal edema.  Duplex on 02/16/2014 revealed no evidence of DVT, but a 3.9 x 1.2 x 2.7 cm left inguinal node.  Abdominal and pelvic CT scan on 03/01/2014 revealed mild-moderate bilateral external iliac and left inguinal adenopathy.    He received 36 Gy from 05/24/2014 until 06/20/2014 to the whole pelvis.  PET scan on 08/22/2014 revealed no evidence of active lymphoma.  Symptomatically, he is fatigued.  He denies any B symptoms.  He has had diarrhea since radiation.  He has new recurrent left lower extremity swelling.    Plan: 1. Review  PET scan. 2. Labs:  CBC with diff, CMP, LDH, uric acid. 3. Left hip films- pain. 4. Left lower extremity duplex- r/o DVT 5. RTC in 3 months for MD assessment and labs (CBC with diff, CMP, LDH, ferritin, ESR).  Addendum:  Duplex study revealed no DVT.  Hip fils revealed mild degenerative change and no evidence of fracture.   Lequita Asal, MD  10/10/2014, 9:50 AM

## 2014-10-10 NOTE — Telephone Encounter (Signed)
Called pt about hip x-ray and reported findings.  Pt verbalized understanding. No concerns noted

## 2014-10-10 NOTE — Telephone Encounter (Signed)
Left msg asking to clarify md order for left hip xray. DG artho hip xray is the wrong tests that was ordered. Needs xray of hip/pelvis 2-3 view of left hip. Verbal order obtained from Dr. Zenia Resides to order the correct test- DG HIP UNILAT W OR W/O PELVIS 2-3 VIEWS LEFT.  Notified nicola new order in [facility]. MD to cnl DG artho hip xray to order the new DG Hip unilateral 2-3 view as ordered in [facility].

## 2014-10-11 ENCOUNTER — Ambulatory Visit: Payer: 59 | Admitting: Hematology and Oncology

## 2014-10-11 ENCOUNTER — Other Ambulatory Visit: Payer: 59

## 2014-10-17 ENCOUNTER — Encounter: Payer: Self-pay | Admitting: Hematology and Oncology

## 2014-10-24 ENCOUNTER — Ambulatory Visit: Payer: 59 | Admitting: Hematology and Oncology

## 2014-11-10 ENCOUNTER — Ambulatory Visit: Payer: 59 | Admitting: Hematology and Oncology

## 2014-11-24 ENCOUNTER — Ambulatory Visit: Payer: 59 | Admitting: Radiation Oncology

## 2015-01-10 ENCOUNTER — Ambulatory Visit: Payer: 59 | Admitting: Hematology and Oncology

## 2015-01-10 ENCOUNTER — Other Ambulatory Visit: Payer: 59

## 2015-01-10 ENCOUNTER — Other Ambulatory Visit: Payer: Self-pay

## 2015-01-10 DIAGNOSIS — C8100 Nodular lymphocyte predominant Hodgkin lymphoma, unspecified site: Secondary | ICD-10-CM

## 2015-01-12 ENCOUNTER — Inpatient Hospital Stay: Payer: 59 | Admitting: Hematology and Oncology

## 2015-01-12 ENCOUNTER — Inpatient Hospital Stay: Payer: 59

## 2015-01-20 ENCOUNTER — Other Ambulatory Visit: Payer: Self-pay

## 2015-01-20 DIAGNOSIS — C8100 Nodular lymphocyte predominant Hodgkin lymphoma, unspecified site: Secondary | ICD-10-CM

## 2015-01-23 ENCOUNTER — Other Ambulatory Visit: Payer: Self-pay

## 2015-01-23 ENCOUNTER — Inpatient Hospital Stay: Payer: 59

## 2015-01-23 ENCOUNTER — Encounter: Payer: Self-pay | Admitting: Hematology and Oncology

## 2015-01-23 ENCOUNTER — Inpatient Hospital Stay: Payer: 59 | Attending: Hematology and Oncology | Admitting: Hematology and Oncology

## 2015-01-23 VITALS — BP 160/78 | HR 85 | Temp 98.0°F | Resp 18 | Ht 74.0 in | Wt 396.6 lb

## 2015-01-23 DIAGNOSIS — I10 Essential (primary) hypertension: Secondary | ICD-10-CM | POA: Insufficient documentation

## 2015-01-23 DIAGNOSIS — Z8639 Personal history of other endocrine, nutritional and metabolic disease: Secondary | ICD-10-CM | POA: Insufficient documentation

## 2015-01-23 DIAGNOSIS — Z79899 Other long term (current) drug therapy: Secondary | ICD-10-CM | POA: Insufficient documentation

## 2015-01-23 DIAGNOSIS — R5383 Other fatigue: Secondary | ICD-10-CM | POA: Diagnosis not present

## 2015-01-23 DIAGNOSIS — I129 Hypertensive chronic kidney disease with stage 1 through stage 4 chronic kidney disease, or unspecified chronic kidney disease: Secondary | ICD-10-CM | POA: Diagnosis not present

## 2015-01-23 DIAGNOSIS — Z923 Personal history of irradiation: Secondary | ICD-10-CM | POA: Insufficient documentation

## 2015-01-23 DIAGNOSIS — N189 Chronic kidney disease, unspecified: Secondary | ICD-10-CM | POA: Diagnosis not present

## 2015-01-23 DIAGNOSIS — R590 Localized enlarged lymph nodes: Secondary | ICD-10-CM

## 2015-01-23 DIAGNOSIS — Z7982 Long term (current) use of aspirin: Secondary | ICD-10-CM | POA: Diagnosis not present

## 2015-01-23 DIAGNOSIS — C8100 Nodular lymphocyte predominant Hodgkin lymphoma, unspecified site: Secondary | ICD-10-CM

## 2015-01-23 DIAGNOSIS — R609 Edema, unspecified: Secondary | ICD-10-CM

## 2015-01-23 DIAGNOSIS — E871 Hypo-osmolality and hyponatremia: Secondary | ICD-10-CM | POA: Diagnosis not present

## 2015-01-23 DIAGNOSIS — Z8571 Personal history of Hodgkin lymphoma: Secondary | ICD-10-CM | POA: Diagnosis present

## 2015-01-23 DIAGNOSIS — E119 Type 2 diabetes mellitus without complications: Secondary | ICD-10-CM | POA: Diagnosis not present

## 2015-01-23 DIAGNOSIS — M25552 Pain in left hip: Secondary | ICD-10-CM | POA: Diagnosis not present

## 2015-01-23 DIAGNOSIS — E785 Hyperlipidemia, unspecified: Secondary | ICD-10-CM | POA: Insufficient documentation

## 2015-01-23 DIAGNOSIS — Z8781 Personal history of (healed) traumatic fracture: Secondary | ICD-10-CM | POA: Insufficient documentation

## 2015-01-23 DIAGNOSIS — Z87891 Personal history of nicotine dependence: Secondary | ICD-10-CM | POA: Diagnosis not present

## 2015-01-23 DIAGNOSIS — Z7984 Long term (current) use of oral hypoglycemic drugs: Secondary | ICD-10-CM | POA: Diagnosis not present

## 2015-01-23 LAB — COMPREHENSIVE METABOLIC PANEL
ALT: 20 U/L (ref 17–63)
AST: 24 U/L (ref 15–41)
Albumin: 4 g/dL (ref 3.5–5.0)
Alkaline Phosphatase: 97 U/L (ref 38–126)
Anion gap: 8 (ref 5–15)
BUN: 10 mg/dL (ref 6–20)
CO2: 24 mmol/L (ref 22–32)
Calcium: 8.3 mg/dL — ABNORMAL LOW (ref 8.9–10.3)
Chloride: 101 mmol/L (ref 101–111)
Creatinine, Ser: 0.75 mg/dL (ref 0.61–1.24)
GFR calc Af Amer: >60 mL/min (ref >60)
GFR calc non Af Amer: >60 mL/min (ref >60)
Glucose, Bld: 154 mg/dL — ABNORMAL HIGH (ref 65–99)
Potassium: 4 mmol/L (ref 3.5–5.1)
Sodium: 133 mmol/L — ABNORMAL LOW (ref 135–145)
Total Bilirubin: 0.4 mg/dL (ref 0.3–1.2)
Total Protein: 7.4 g/dL (ref 6.5–8.1)

## 2015-01-23 LAB — CBC WITH DIFFERENTIAL/PLATELET
Basophils Absolute: 0 10*3/uL (ref 0–0.1)
Basophils Relative: 1 %
Eosinophils Absolute: 0.1 10*3/uL (ref 0–0.7)
Eosinophils Relative: 2 %
HCT: 43.8 % (ref 40.0–52.0)
Hemoglobin: 15.2 g/dL (ref 13.0–18.0)
Lymphocytes Relative: 15 %
Lymphs Abs: 0.8 10*3/uL — ABNORMAL LOW (ref 1.0–3.6)
MCH: 31.3 pg (ref 26.0–34.0)
MCHC: 34.7 g/dL (ref 32.0–36.0)
MCV: 90.3 fL (ref 80.0–100.0)
Monocytes Absolute: 0.5 10*3/uL (ref 0.2–1.0)
Monocytes Relative: 9 %
Neutro Abs: 3.7 10*3/uL (ref 1.4–6.5)
Neutrophils Relative %: 73 %
Platelets: 269 10*3/uL (ref 150–440)
RBC: 4.85 MIL/uL (ref 4.40–5.90)
RDW: 13.5 % (ref 11.5–14.5)
WBC: 5.1 10*3/uL (ref 3.8–10.6)

## 2015-01-23 LAB — SEDIMENTATION RATE: Sed Rate: 15 mm/hr (ref 0–15)

## 2015-01-23 LAB — FERRITIN: Ferritin: 331 ng/mL (ref 24–336)

## 2015-01-23 LAB — LACTATE DEHYDROGENASE: LDH: 103 U/L (ref 98–192)

## 2015-01-23 NOTE — Progress Notes (Signed)
[facility] day:  01/23/2015  Chief Complaint: Colton Choi is a 49 y.o. 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  HPI: The patient was last seen in the medical oncology clinic on 10/10/2014.  At that time, he was seen for reassessment after completion of radiation and follow-up PET scan.  PET scan on 08/22/2014 revealed no evidence of active lymphoma.  Symptomatically, he noted constant fatigue. His left hip began hurting awhile after radiation. He noted recurrence of the left lower extremity edema associated with decreased mobility.  Duplex study revealed no DVT.  Hip films revealed mild degenerative change and no evidence of fracture.  During the interim, he has done well.  Overall, he is feeling a lot better.  His hip still bothers him with any change in weather.  Diarrhea has resolved.  He has chronic mild left lower extremity edema.  He denies any B symptoms.  Past Medical History  Diagnosis Date  . Hypertension   . Diabetes mellitus without complication   . Broken ankle 20 years ago    Right  . Gout   . Chronic kidney disease   . Hyperlipidemia   . Cancer     lymphoma    Past Surgical History  Procedure Laterality Date  . Left grion excision  03/15/14  . Ankle surgery  1996    No family history on file.  Social History:  reports that he quit smoking about 2 years ago. His smoking use included Cigarettes. He has a 15 pack-year smoking history. He has never used smokeless tobacco. He reports that he drinks alcohol. He reports that he does not use illicit drugs.  The patient is alone today.  Allergies:  Allergies  Allergen Reactions  . Sulfa Antibiotics     Current Medications: Current Outpatient Prescriptions  Medication Sig Dispense Refill  . amLODipine (NORVASC) 10 MG tablet Take by mouth.    [name] aspirin 81 MG tablet Take 81 mg by mouth daily.    [name] glimepiride  (AMARYL) 2 MG tablet Take by mouth.    [name] glipiZIDE (GLUCOTROL XL) 2.5 MG 24 hr tablet Take by mouth.    [name] ibuprofen (ADVIL,MOTRIN) 200 MG tablet Take 200 mg by mouth every 6 (six) hours as needed (Pt states that he takes 3 to 6 tablets daily for pain...).     [name] lisinopril-hydrochlorothiazide (PRINZIDE,ZESTORETIC) 20-12.5 MG per tablet Take 2 tablets by mouth daily.    . metFORMIN (GLUCOPHAGE) 500 MG tablet Take 500 mg by mouth 4 (four) times daily.    . pioglitazone (ACTOS) 30 MG tablet Take by mouth.     No current facility-administered medications for this visit.    Review of Systems:  GENERAL:  Feels good.  No fevers, sweats or weight loss. PERFORMANCE STATUS (ECOG):  0-1 HEENT:  No visual changes, runny nose, sore throat, mouth sores or tenderness. Lungs: No shortness of breath or cough.  No hemoptysis. Cardiac:  No chest pain, palpitations, orthopnea, or PND. GI:  No nausea, vomiting, diarrhea, constipation, melena or hematochezia. GU:  No urgency, frequency, dysuria, or hematuria. Musculoskeletal:  No back pain.  Hip pain with change in weather (today it is raining).  No muscle tenderness. Extremities:  Left lower extremity swelling.  No pain. Skin:  No rashes or skin changes. Neuro:  No headache, numbness or weakness, balance or coordination issues. Endocrine:  Diabetes.  No tthyroid issues, hot  flashes or night sweats. Psych:  No mood changes, depression or anxiety. Pain:  No focal pain. Review of systems:  All other systems reviewed and found to be negative.  Physical Exam: Blood pressure 160/78, pulse 85, temperature 98 F (36.7 C), temperature source Tympanic, resp. rate 18, height 6' 2"  (1.88 m), weight 396 lb 9.7 oz (179.9 kg). GENERAL:  Well developed, well nourished, sitting comfortably in the exam room in no acute distress. MENTAL STATUS:  Alert and oriented to person, place and time. HEAD:  Short dark hair.  Normocephalic, atraumatic, face symmetric, no Cushingoid  features. EYES:  Brown eyes.  Pupils equal round and reactive to light and accomodation.  No conjunctivitis or scleral icterus. ENT:  Oropharynx clear without lesion.  Tongue normal. Mucous membranes moist.  RESPIRATORY:  Clear to auscultation without rales, wheezes or rhonchi. CARDIOVASCULAR:  Regular rate and rhythm without murmur, rub or gallop. ABDOMEN:  Soft, non-tender, with active bowel sounds, and no hepatosplenomegaly.  No masses. SKIN:  No rashes, ulcers or lesions. EXTREMITIES: Chronic mild dense left lower extremity edema.  No skin discoloration or tenderness.  No palpable cords. LYMPH NODES: No palpable cervical, supraclavicular, axillary or inguinal adenopathy  NEUROLOGICAL: Unremarkable. PSYCH:  Appropriate.  Appointment on 01/23/2015  Component Date Value Ref Range Status  . WBC 01/23/2015 5.1  3.8 - 10.6 K/uL Final  . RBC 01/23/2015 4.85  4.40 - 5.90 MIL/uL Final  . Hemoglobin 01/23/2015 15.2  13.0 - 18.0 g/dL Final  . HCT 01/23/2015 43.8  40.0 - 52.0 % Final  . MCV 01/23/2015 90.3  80.0 - 100.0 fL Final  . MCH 01/23/2015 31.3  26.0 - 34.0 pg Final  . MCHC 01/23/2015 34.7  32.0 - 36.0 g/dL Final  . RDW 01/23/2015 13.5  11.5 - 14.5 % Final  . Platelets 01/23/2015 269  150 - 440 K/uL Final  . Neutrophils Relative % 01/23/2015 73   Final  . Neutro Abs 01/23/2015 3.7  1.4 - 6.5 K/uL Final  . Lymphocytes Relative 01/23/2015 15   Final  . Lymphs Abs 01/23/2015 0.8* 1.0 - 3.6 K/uL Final  . Monocytes Relative 01/23/2015 9   Final  . Monocytes Absolute 01/23/2015 0.5  0.2 - 1.0 K/uL Final  . Eosinophils Relative 01/23/2015 2   Final  . Eosinophils Absolute 01/23/2015 0.1  0 - 0.7 K/uL Final  . Basophils Relative 01/23/2015 1   Final  . Basophils Absolute 01/23/2015 0.0  0 - 0.1 K/uL Final    Assessment:  Colton Choi is a 49 y.o. 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  He  presented a 2-3 month history of intermittent left lower extremity pedal edema.  Duplex on 02/16/2014 revealed no evidence of DVT, but a 3.9 x 1.2 x 2.7 cm left inguinal node.  Abdominal and pelvic CT scan on 03/01/2014 revealed mild-moderate bilateral external iliac and left inguinal adenopathy.    He received 36 Gy from 05/24/2014 until 06/20/2014 to the whole pelvis.  PET scan on 08/22/2014 revealed no evidence of active lymphoma.  He has had recurrent left lower extremity swelling. Duplex study on 10/10/2014 revealed no evidence of DVT  Symptomatically, he is doing well.  He denies any B symptoms.  He has chronic mild left lower extremity edema.    Plan: 1. Labs:  CBC with diff, CMP, LDH, ferritin, ESR. 2. Discuss mild hyponatremia and hypocalcemia with patient. 3. Schedule abdomen/pelvis CT scan  with contrast 03/2014 4. RTC after CT scan for MD assessment, labs (CBC with diff, CMP, LDH, ferritin, ESR), and review of scans.   Lequita Asal, MD  01/23/2015, 8:58 AM

## 2015-01-23 NOTE — Progress Notes (Signed)
Patient is here for follow-up of lymphoma. Patient states that he has been doing very good and offers no complaints today.

## 2015-02-12 DIAGNOSIS — C801 Malignant (primary) neoplasm, unspecified: Secondary | ICD-10-CM

## 2015-02-12 HISTORY — DX: Malignant (primary) neoplasm, unspecified: C80.1

## 2015-03-23 ENCOUNTER — Inpatient Hospital Stay: Payer: BLUE CROSS/BLUE SHIELD | Attending: Hematology and Oncology

## 2015-03-23 ENCOUNTER — Ambulatory Visit
Admission: RE | Admit: 2015-03-23 | Discharge: 2015-03-23 | Disposition: A | Discharge status: Home / Self Care | Payer: BLUE CROSS/BLUE SHIELD | Source: Ambulatory Visit | Attending: Hematology and Oncology | Admitting: Hematology and Oncology

## 2015-03-23 DIAGNOSIS — M1612 Unilateral primary osteoarthritis, left hip: Secondary | ICD-10-CM | POA: Diagnosis not present

## 2015-03-23 DIAGNOSIS — Z7984 Long term (current) use of oral hypoglycemic drugs: Secondary | ICD-10-CM | POA: Diagnosis not present

## 2015-03-23 DIAGNOSIS — N189 Chronic kidney disease, unspecified: Secondary | ICD-10-CM | POA: Insufficient documentation

## 2015-03-23 DIAGNOSIS — I700 Atherosclerosis of aorta: Secondary | ICD-10-CM | POA: Diagnosis not present

## 2015-03-23 DIAGNOSIS — K760 Fatty (change of) liver, not elsewhere classified: Secondary | ICD-10-CM | POA: Insufficient documentation

## 2015-03-23 DIAGNOSIS — Z8781 Personal history of (healed) traumatic fracture: Secondary | ICD-10-CM | POA: Diagnosis not present

## 2015-03-23 DIAGNOSIS — Z7982 Long term (current) use of aspirin: Secondary | ICD-10-CM | POA: Diagnosis not present

## 2015-03-23 DIAGNOSIS — E785 Hyperlipidemia, unspecified: Secondary | ICD-10-CM | POA: Diagnosis not present

## 2015-03-23 DIAGNOSIS — E119 Type 2 diabetes mellitus without complications: Secondary | ICD-10-CM | POA: Diagnosis not present

## 2015-03-23 DIAGNOSIS — Z8639 Personal history of other endocrine, nutritional and metabolic disease: Secondary | ICD-10-CM | POA: Diagnosis not present

## 2015-03-23 DIAGNOSIS — Z87891 Personal history of nicotine dependence: Secondary | ICD-10-CM | POA: Insufficient documentation

## 2015-03-23 DIAGNOSIS — C8100 Nodular lymphocyte predominant Hodgkin lymphoma, unspecified site: Secondary | ICD-10-CM

## 2015-03-23 DIAGNOSIS — Z79899 Other long term (current) drug therapy: Secondary | ICD-10-CM | POA: Diagnosis not present

## 2015-03-23 DIAGNOSIS — I129 Hypertensive chronic kidney disease with stage 1 through stage 4 chronic kidney disease, or unspecified chronic kidney disease: Secondary | ICD-10-CM | POA: Diagnosis not present

## 2015-03-23 LAB — CBC WITH DIFFERENTIAL/PLATELET
Basophils Absolute: 0 10*3/uL (ref 0–0.1)
Basophils Relative: 0 %
Eosinophils Absolute: 0.2 10*3/uL (ref 0–0.7)
Eosinophils Relative: 3 %
HCT: 43 % (ref 40.0–52.0)
Hemoglobin: 15.3 g/dL (ref 13.0–18.0)
Lymphocytes Relative: 16 %
Lymphs Abs: 0.8 10*3/uL — ABNORMAL LOW (ref 1.0–3.6)
MCH: 31.6 pg (ref 26.0–34.0)
MCHC: 35.6 g/dL (ref 32.0–36.0)
MCV: 88.8 fL (ref 80.0–100.0)
Monocytes Absolute: 0.4 10*3/uL (ref 0.2–1.0)
Monocytes Relative: 8 %
Neutro Abs: 3.8 10*3/uL (ref 1.4–6.5)
Neutrophils Relative %: 73 %
Platelets: 235 10*3/uL (ref 150–440)
RBC: 4.84 MIL/uL (ref 4.40–5.90)
RDW: 13.1 % (ref 11.5–14.5)
WBC: 5.3 10*3/uL (ref 3.8–10.6)

## 2015-03-23 LAB — COMPREHENSIVE METABOLIC PANEL
ALT: 19 U/L (ref 17–63)
AST: 17 U/L (ref 15–41)
Albumin: 3.8 g/dL (ref 3.5–5.0)
Alkaline Phosphatase: 90 U/L (ref 38–126)
Anion gap: 6 (ref 5–15)
BUN: 8 mg/dL (ref 6–20)
CO2: 26 mmol/L (ref 22–32)
Calcium: 8.8 mg/dL — ABNORMAL LOW (ref 8.9–10.3)
Chloride: 101 mmol/L (ref 101–111)
Creatinine, Ser: 0.7 mg/dL (ref 0.61–1.24)
GFR calc Af Amer: >60 mL/min (ref >60)
GFR calc non Af Amer: >60 mL/min (ref >60)
Glucose, Bld: 133 mg/dL — ABNORMAL HIGH (ref 65–99)
Potassium: 3.9 mmol/L (ref 3.5–5.1)
Sodium: 133 mmol/L — ABNORMAL LOW (ref 135–145)
Total Bilirubin: 0.6 mg/dL (ref 0.3–1.2)
Total Protein: 7.4 g/dL (ref 6.5–8.1)

## 2015-03-23 LAB — FERRITIN: Ferritin: 311 ng/mL (ref 24–336)

## 2015-03-23 LAB — LACTATE DEHYDROGENASE: LDH: 97 U/L — ABNORMAL LOW (ref 98–192)

## 2015-03-23 LAB — POCT I-STAT CREATININE: Creatinine, Ser: 0.8 mg/dL (ref 0.61–1.24)

## 2015-03-23 LAB — SEDIMENTATION RATE: Sed Rate: 20 mm/hr — ABNORMAL HIGH (ref 0–15)

## 2015-03-23 MED ORDER — IOHEXOL 350 MG/ML SOLN
125.0000 mL | Freq: Once | INTRAVENOUS | Status: AC | PRN
Start: 2015-03-23 — End: 2015-03-23
  Administered 2015-03-23: 125 mL via INTRAVENOUS

## 2015-03-27 ENCOUNTER — Telehealth: Payer: Self-pay | Admitting: *Deleted

## 2015-03-27 ENCOUNTER — Inpatient Hospital Stay (HOSPITAL_BASED_OUTPATIENT_CLINIC_OR_DEPARTMENT_OTHER): Payer: BLUE CROSS/BLUE SHIELD | Admitting: Internal Medicine

## 2015-03-27 VITALS — BP 147/81 | HR 84 | Temp 95.1°F | Resp 18 | Ht 74.0 in | Wt 393.5 lb

## 2015-03-27 DIAGNOSIS — Z79899 Other long term (current) drug therapy: Secondary | ICD-10-CM

## 2015-03-27 DIAGNOSIS — C8100 Nodular lymphocyte predominant Hodgkin lymphoma, unspecified site: Secondary | ICD-10-CM

## 2015-03-27 DIAGNOSIS — Z8639 Personal history of other endocrine, nutritional and metabolic disease: Secondary | ICD-10-CM

## 2015-03-27 DIAGNOSIS — E785 Hyperlipidemia, unspecified: Secondary | ICD-10-CM

## 2015-03-27 DIAGNOSIS — E119 Type 2 diabetes mellitus without complications: Secondary | ICD-10-CM | POA: Diagnosis not present

## 2015-03-27 DIAGNOSIS — I129 Hypertensive chronic kidney disease with stage 1 through stage 4 chronic kidney disease, or unspecified chronic kidney disease: Secondary | ICD-10-CM

## 2015-03-27 DIAGNOSIS — Z7984 Long term (current) use of oral hypoglycemic drugs: Secondary | ICD-10-CM

## 2015-03-27 DIAGNOSIS — M1612 Unilateral primary osteoarthritis, left hip: Secondary | ICD-10-CM

## 2015-03-27 DIAGNOSIS — N189 Chronic kidney disease, unspecified: Secondary | ICD-10-CM

## 2015-03-27 DIAGNOSIS — Z7982 Long term (current) use of aspirin: Secondary | ICD-10-CM

## 2015-03-27 DIAGNOSIS — Z87891 Personal history of nicotine dependence: Secondary | ICD-10-CM

## 2015-03-27 DIAGNOSIS — Z8781 Personal history of (healed) traumatic fracture: Secondary | ICD-10-CM

## 2015-03-27 NOTE — Telephone Encounter (Signed)
After I contacted [facility] at [phone] I got appt for Dr. Earnestine Leys for 04/05/14 2:30 and pt to arrive 10 min early. Bring copy of insurance card. Faxed all notes and insurance to their office [phone]

## 2015-03-27 NOTE — Progress Notes (Signed)
Pt here for CT results from 2/9.  No other concerns noted.

## 2015-03-27 NOTE — Progress Notes (Signed)
[facility] day:  03/27/2015  Chief Complaint: Colton Choi is a 50 y.o. 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  HPI:  Since last visit Colton Choi has done well clinically. He has normal energy level, denies fevers, chills, night sweats, or lymphadenopathy. He continues to complain of left hip pain, which significantly reduces his ability to perform the activities of daily living.   Past Medical History  Diagnosis Date  . Hypertension   . Diabetes mellitus without complication ([place])   . Broken ankle 20 years ago    Right  . Gout   . Chronic kidney disease   . Hyperlipidemia   . Cancer [facility])     lymphoma    Past Surgical History  Procedure Laterality Date  . Left grion excision  03/15/14  . Ankle surgery  1996    Family History  Problem Relation Age of Onset  . Hypertension Mother   . Diabetes Sister   . Hypertension Sister   . Diabetes Maternal Grandmother     Social History:  reports that he quit smoking about 3 years ago. His smoking use included Cigarettes. He has a 15 pack-year smoking history. He has never used smokeless tobacco. He reports that he drinks alcohol. He reports that he does not use illicit drugs.  The patient is alone today.  Allergies:  Allergies  Allergen Reactions  . Sulfa Antibiotics     Current Medications: Current Outpatient Prescriptions  Medication Sig Dispense Refill  . amLODipine (NORVASC) 10 MG tablet Take by mouth.    [name] aspirin 81 MG tablet Take 81 mg by mouth daily.    [name] glimepiride (AMARYL) 2 MG tablet Take by mouth.    [name] glipiZIDE (GLUCOTROL XL) 2.5 MG 24 hr tablet Take by mouth.    [name] ibuprofen (ADVIL,MOTRIN) 200 MG tablet Take 200 mg by mouth every 6 (six) hours as needed (Pt states that he takes 3 to 6 tablets daily for pain...).     [name] lisinopril-hydrochlorothiazide (PRINZIDE,ZESTORETIC) 20-12.5 MG per tablet Take 2  tablets by mouth daily.    . metFORMIN (GLUCOPHAGE) 500 MG tablet Take 500 mg by mouth 4 (four) times daily.    . pioglitazone (ACTOS) 30 MG tablet Take by mouth.     No current facility-administered medications for this visit.    Review of Systems:  GENERAL:  Feels good.  No fevers, sweats or weight loss. PERFORMANCE STATUS (ECOG):  1 HEENT:  No visual changes, runny nose, sore throat, mouth sores or tenderness. Lungs: No shortness of breath or cough.  No hemoptysis. Cardiac:  No chest pain, palpitations, orthopnea, or PND. GI:  No nausea, vomiting, diarrhea, constipation, melena or hematochezia. GU:  No urgency, frequency, dysuria, or hematuria. Musculoskeletal:  No back pain.  Hip pain with change in weather (today it is raining).  No muscle tenderness. Extremities:  Left lower extremity swelling.  No pain. Skin:  No rashes or skin changes. Neuro:  No headache, numbness or weakness, balance or coordination issues. Endocrine:  Diabetes.  No tthyroid issues, hot flashes or night sweats. Psych:  No mood changes, depression or anxiety. Pain:  No focal pain. Review of systems:  All other systems reviewed and found to be negative.  Physical Exam: Blood pressure 147/81, pulse 84, temperature 95.1 F (35.1 C), temperature source Tympanic, resp. rate 18, height _0  (1.88 m), weight 393 lb 8.3 oz (178.5  kg). GENERAL:  Well developed, morbidly obese Hispanic male, sitting comfortably in the exam room in no acute distress. MENTAL STATUS:  Alert and oriented to person, place and time. HEAD:  Short dark hair.  Normocephalic, atraumatic, face symmetric, no Cushingoid features. EYES:  Brown eyes.  Pupils equal round and reactive to light and accomodation.  No conjunctivitis or scleral icterus. ENT:  Oropharynx clear without lesion.  Tongue normal. Mucous membranes moist.  RESPIRATORY:  Clear to auscultation without rales, wheezes or rhonchi. CARDIOVASCULAR:  Regular rate and rhythm without  murmur, rub or gallop. ABDOMEN:  Soft, non-tender, with active bowel sounds, and no hepatosplenomegaly.  No masses. SKIN:  No rashes, ulcers or lesions. EXTREMITIES: Chronic mild dense left lower extremity edema.  No skin discoloration or tenderness.  No palpable cords. LYMPH NODES: No palpable cervical, supraclavicular, axillary or inguinal adenopathy  NEUROLOGICAL: Unremarkable. PSYCH:  Appropriate.  No visits with results within 3 Day(s) from this visit. Latest known visit with results is:  Hospital Outpatient Visit on 03/23/2015  Component Date Value Ref Range Status  . Creatinine, Ser 03/23/2015 0.80  0.61 - 1.24 mg/dL Final    Assessment:  Colton Choi is a 50 y.o. 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  He presented a 2-3 month history of intermittent left lower extremity pedal edema.  Duplex on 02/16/2014 revealed no evidence of DVT, but a 3.9 x 1.2 x 2.7 cm left inguinal node.  Abdominal and pelvic CT scan on 03/01/2014 revealed mild-moderate bilateral external iliac and left inguinal adenopathy.    He received 36 Gy from 05/24/2014 until 06/20/2014 to the whole pelvis.  PET scan on 08/22/2014 revealed no evidence of active lymphoma.  He has had recurrent left lower extremity swelling. Duplex study on 10/10/2014 revealed no evidence of DVT CT abdomen and pelvis from 03/23/2015 showed no evidence of lymphadenopathy. He is currently in complete remission  Left hip pain-likely secondary to osteoarthritis, as detected on an x-ray from August 2016. Patient is symptomatic with decreased quality of life.   Plan: 1. Labs:  CBC with diff, CMP, LDH, ferritin, ESR. 2. RTC in 3 months MD assessment, labs (CBC with diff, CMP, LDH). 3. Refer to Ortho, prescribe cane   Roxana Hires, MD  03/27/2015, 8:51 AM

## 2015-06-26 ENCOUNTER — Other Ambulatory Visit: Payer: BLUE CROSS/BLUE SHIELD

## 2015-06-26 ENCOUNTER — Ambulatory Visit: Payer: BLUE CROSS/BLUE SHIELD | Admitting: Family Medicine

## 2015-06-27 ENCOUNTER — Inpatient Hospital Stay: Payer: BLUE CROSS/BLUE SHIELD | Admitting: Family Medicine

## 2015-06-27 ENCOUNTER — Inpatient Hospital Stay: Payer: BLUE CROSS/BLUE SHIELD

## 2015-07-28 ENCOUNTER — Inpatient Hospital Stay: Payer: BLUE CROSS/BLUE SHIELD

## 2015-07-28 ENCOUNTER — Inpatient Hospital Stay: Payer: BLUE CROSS/BLUE SHIELD | Admitting: Family Medicine

## 2015-08-21 ENCOUNTER — Inpatient Hospital Stay: Payer: BLUE CROSS/BLUE SHIELD

## 2015-08-21 ENCOUNTER — Inpatient Hospital Stay: Payer: BLUE CROSS/BLUE SHIELD | Attending: Family Medicine | Admitting: Family Medicine

## 2015-08-21 VITALS — BP 142/86 | HR 76 | Temp 98.4°F | Resp 18 | Wt >= 6400 oz

## 2015-08-21 DIAGNOSIS — Z7982 Long term (current) use of aspirin: Secondary | ICD-10-CM | POA: Diagnosis not present

## 2015-08-21 DIAGNOSIS — Z8572 Personal history of non-Hodgkin lymphomas: Secondary | ICD-10-CM | POA: Diagnosis not present

## 2015-08-21 DIAGNOSIS — Z7984 Long term (current) use of oral hypoglycemic drugs: Secondary | ICD-10-CM

## 2015-08-21 DIAGNOSIS — M25552 Pain in left hip: Secondary | ICD-10-CM | POA: Insufficient documentation

## 2015-08-21 DIAGNOSIS — Z87891 Personal history of nicotine dependence: Secondary | ICD-10-CM | POA: Insufficient documentation

## 2015-08-21 DIAGNOSIS — N189 Chronic kidney disease, unspecified: Secondary | ICD-10-CM | POA: Diagnosis not present

## 2015-08-21 DIAGNOSIS — I129 Hypertensive chronic kidney disease with stage 1 through stage 4 chronic kidney disease, or unspecified chronic kidney disease: Secondary | ICD-10-CM | POA: Diagnosis not present

## 2015-08-21 DIAGNOSIS — C8100 Nodular lymphocyte predominant Hodgkin lymphoma, unspecified site: Secondary | ICD-10-CM

## 2015-08-21 DIAGNOSIS — Z79899 Other long term (current) drug therapy: Secondary | ICD-10-CM | POA: Insufficient documentation

## 2015-08-21 DIAGNOSIS — E119 Type 2 diabetes mellitus without complications: Secondary | ICD-10-CM | POA: Diagnosis not present

## 2015-08-21 DIAGNOSIS — E785 Hyperlipidemia, unspecified: Secondary | ICD-10-CM | POA: Diagnosis not present

## 2015-08-21 DIAGNOSIS — R609 Edema, unspecified: Secondary | ICD-10-CM | POA: Insufficient documentation

## 2015-08-21 DIAGNOSIS — C8105 Nodular lymphocyte predominant Hodgkin lymphoma, lymph nodes of inguinal region and lower limb: Secondary | ICD-10-CM

## 2015-08-21 LAB — COMPREHENSIVE METABOLIC PANEL
ALBUMIN: 3.7 g/dL (ref 3.5–5.0)
ALK PHOS: 86 U/L (ref 38–126)
ALT: 19 U/L (ref 17–63)
AST: 24 U/L (ref 15–41)
Anion gap: 7 (ref 5–15)
BILIRUBIN TOTAL: 0.6 mg/dL (ref 0.3–1.2)
BUN: 10 mg/dL (ref 6–20)
CHLORIDE: 102 mmol/L (ref 101–111)
CO2: 25 mmol/L (ref 22–32)
Calcium: 8.4 mg/dL — ABNORMAL LOW (ref 8.9–10.3)
Creatinine, Ser: 0.71 mg/dL (ref 0.61–1.24)
GFR calc Af Amer: >60 mL/min (ref >60)
GFR calc non Af Amer: >60 mL/min (ref >60)
Glucose, Bld: 171 mg/dL — ABNORMAL HIGH (ref 65–99)
POTASSIUM: 3.7 mmol/L (ref 3.5–5.1)
Sodium: 134 mmol/L — ABNORMAL LOW (ref 135–145)
TOTAL PROTEIN: 7 g/dL (ref 6.5–8.1)

## 2015-08-21 LAB — CBC WITH DIFFERENTIAL/PLATELET
BASOS PCT: 1 %
Basophils Absolute: 0 10*3/uL (ref 0–0.1)
Eosinophils Absolute: 0.1 10*3/uL (ref 0–0.7)
Eosinophils Relative: 3 %
HEMATOCRIT: 43.1 % (ref 40.0–52.0)
HEMOGLOBIN: 15.4 g/dL (ref 13.0–18.0)
Lymphocytes Relative: 19 %
Lymphs Abs: 0.8 10*3/uL — ABNORMAL LOW (ref 1.0–3.6)
MCH: 32.1 pg (ref 26.0–34.0)
MCHC: 35.6 g/dL (ref 32.0–36.0)
MCV: 90.1 fL (ref 80.0–100.0)
Monocytes Absolute: 0.4 10*3/uL (ref 0.2–1.0)
Monocytes Relative: 10 %
NEUTROS ABS: 3.1 10*3/uL (ref 1.4–6.5)
Neutrophils Relative %: 69 %
Platelets: 242 10*3/uL (ref 150–440)
RBC: 4.79 MIL/uL (ref 4.40–5.90)
RDW: 13.7 % (ref 11.5–14.5)
WBC: 4.5 10*3/uL (ref 3.8–10.6)

## 2015-08-21 LAB — LACTATE DEHYDROGENASE: LDH: 109 U/L (ref 98–192)

## 2015-08-21 NOTE — Progress Notes (Signed)
[facility] day:  08/21/2015  Chief Complaint: Colton Choi is a 50 y.o. 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  HPI:  Since last visit Colton Choi has done well clinically. He has normal energy level, denies fevers, chills, night sweats, or lymphadenopathy. He continues to complain of left hip pain. She was evaluated by orthopedics and cortisone injection given for treatment of bursitis. Patient was told he would require a hip replacement in the future but that will make loss and exercise could considerably reduce his pain.  Past Medical History  Diagnosis Date  . Hypertension   . Diabetes mellitus without complication ([place])   . Broken ankle 20 years ago    Right  . Gout   . Chronic kidney disease   . Hyperlipidemia   . Cancer [facility])     lymphoma    Past Surgical History  Procedure Laterality Date  . Left grion excision  03/15/14  . Ankle surgery  1996    Family History  Problem Relation Age of Onset  . Hypertension Mother   . Diabetes Sister   . Hypertension Sister   . Diabetes Maternal Grandmother     Social History:  reports that he quit smoking about 3 years ago. His smoking use included Cigarettes. He has a 15 pack-year smoking history. He has never used smokeless tobacco. He reports that he drinks alcohol. He reports that he does not use illicit drugs.  The patient is alone today.  Allergies:  Allergies  Allergen Reactions  . Sulfa Antibiotics     Current Medications: Current Outpatient Prescriptions  Medication Sig Dispense Refill  . amLODipine (NORVASC) 10 MG tablet Take 10 mg by mouth daily.     [name] aspirin 81 MG tablet Take 81 mg by mouth daily.    [name] glimepiride (AMARYL) 2 MG tablet Take 2 mg by mouth daily with breakfast.     . glipiZIDE (GLUCOTROL XL) 2.5 MG 24 hr tablet Take 2.5 mg by mouth daily with breakfast.     . ibuprofen (ADVIL,MOTRIN) 200 MG  tablet Take 200 mg by mouth every 6 (six) hours as needed (Pt states that he takes 3 to 6 tablets daily for pain...).     [name] lisinopril-hydrochlorothiazide (PRINZIDE,ZESTORETIC) 20-12.5 MG per tablet Take 1 tablet by mouth daily.     . meloxicam (MOBIC) 7.5 MG tablet Take 7.5 mg by mouth daily.    . metFORMIN (GLUCOPHAGE) 500 MG tablet Take 500 mg by mouth daily with breakfast.     . pioglitazone (ACTOS) 30 MG tablet Take 30 mg by mouth daily.      No current facility-administered medications for this visit.    Review of Systems:  GENERAL:  Feels good.  No fevers, sweats or weight loss. PERFORMANCE STATUS (ECOG):  1 HEENT:  No visual changes, runny nose, sore throat, mouth sores or tenderness. Lungs: No shortness of breath or cough.  No hemoptysis. Cardiac:  No chest pain, palpitations, orthopnea, or PND. GI:  No nausea, vomiting, diarrhea, constipation, melena or hematochezia. GU:  No urgency, frequency, dysuria, or hematuria. Musculoskeletal:  No back pain.  Left hip pain.  No muscle tenderness. Extremities:  Left lower extremity swelling.  No pain. Skin:  No rashes or skin changes. Neuro:  No headache, numbness or weakness, balance or coordination issues. Endocrine:  Diabetes.  No tthyroid issues, hot flashes or night sweats. Psych:  No mood  changes, depression or anxiety. Pain:  No focal pain. Review of systems:  All other systems reviewed and found to be negative.  Physical Exam: Blood pressure 142/86, pulse 76, temperature 98.4 F (36.9 C), temperature source Tympanic, resp. rate 18, weight 402 lb 5.4 oz (182.5 kg). GENERAL:  Well developed, morbidly obese male, sitting comfortably in the exam room in no acute distress. MENTAL STATUS:  Alert and oriented to person, place and time. HEAD:  Normocephalic, atraumatic, face symmetric, no Cushingoid features. EYES:  Pupils equal round and reactive to light and accomodation.  No conjunctivitis or scleral icterus. ENT:  Oropharynx clear  without lesion.  Tongue normal. Mucous membranes moist.  RESPIRATORY:  Clear to auscultation without rales, wheezes or rhonchi. CARDIOVASCULAR:  Regular rate and rhythm without murmur, rub or gallop. ABDOMEN:  Soft, non-tender, with active bowel sounds, and no hepatosplenomegaly.  No masses. SKIN:  No rashes, ulcers or lesions. EXTREMITIES: Chronic mild dense left lower extremity edema.  No skin discoloration or tenderness.  No palpable cords. LYMPH NODES: No palpable cervical, supraclavicular, axillary or inguinal adenopathy  NEUROLOGICAL: Unremarkable. PSYCH:  Appropriate.  Appointment on 08/21/2015  Component Date Value Ref Range Status  . WBC 08/21/2015 4.5  3.8 - 10.6 K/uL Final  . RBC 08/21/2015 4.79  4.40 - 5.90 MIL/uL Final  . Hemoglobin 08/21/2015 15.4  13.0 - 18.0 g/dL Final  . HCT 08/21/2015 43.1  40.0 - 52.0 % Final  . MCV 08/21/2015 90.1  80.0 - 100.0 fL Final  . MCH 08/21/2015 32.1  26.0 - 34.0 pg Final  . MCHC 08/21/2015 35.6  32.0 - 36.0 g/dL Final  . RDW 08/21/2015 13.7  11.5 - 14.5 % Final  . Platelets 08/21/2015 242  150 - 440 K/uL Final  . Neutrophils Relative % 08/21/2015 69   Final  . Neutro Abs 08/21/2015 3.1  1.4 - 6.5 K/uL Final  . Lymphocytes Relative 08/21/2015 19   Final  . Lymphs Abs 08/21/2015 0.8* 1.0 - 3.6 K/uL Final  . Monocytes Relative 08/21/2015 10   Final  . Monocytes Absolute 08/21/2015 0.4  0.2 - 1.0 K/uL Final  . Eosinophils Relative 08/21/2015 3   Final  . Eosinophils Absolute 08/21/2015 0.1  0 - 0.7 K/uL Final  . Basophils Relative 08/21/2015 1   Final  . Basophils Absolute 08/21/2015 0.0  0 - 0.1 K/uL Final  . Sodium 08/21/2015 134* 135 - 145 mmol/L Final  . Potassium 08/21/2015 3.7  3.5 - 5.1 mmol/L Final  . Chloride 08/21/2015 102  101 - 111 mmol/L Final  . CO2 08/21/2015 25  22 - 32 mmol/L Final  . Glucose, Bld 08/21/2015 171* 65 - 99 mg/dL Final  . BUN 08/21/2015 10  6 - 20 mg/dL Final  . Creatinine, Ser 08/21/2015 0.71  0.61 - 1.24  mg/dL Final  . Calcium 08/21/2015 8.4* 8.9 - 10.3 mg/dL Final  . Total Protein 08/21/2015 7.0  6.5 - 8.1 g/dL Final  . Albumin 08/21/2015 3.7  3.5 - 5.0 g/dL Final  . AST 08/21/2015 24  15 - 41 U/L Final  . ALT 08/21/2015 19  17 - 63 U/L Final  . Alkaline Phosphatase 08/21/2015 86  38 - 126 U/L Final  . Total Bilirubin 08/21/2015 0.6  0.3 - 1.2 mg/dL Final  . GFR calc non Af Amer 08/21/2015 >60  >60 mL/min Final  . GFR calc Af Amer 08/21/2015 >60  >60 mL/min Final   Comment: (NOTE) The eGFR has been calculated using the  CKD EPI equation. This calculation has not been validated in all clinical situations. eGFR's persistently <60 mL/min signify possible Chronic Kidney Disease.   . Anion gap 08/21/2015 7  5 - 15 Final  . LDH 08/21/2015 109  98 - 192 U/L Final    Assessment/ Plan:  Colton Choi is a 50 y.o. 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  He presented a 2-3 month history of intermittent left lower extremity pedal edema.  Duplex on 02/16/2014 revealed no evidence of DVT, but a 3.9 x 1.2 x 2.7 cm left inguinal node.  Abdominal and pelvic CT scan on 03/01/2014 revealed mild-moderate bilateral external iliac and left inguinal adenopathy.    He received 36 Gy from 05/24/2014 until 06/20/2014 to the whole pelvis.  PET scan on 08/22/2014 revealed no evidence of active lymphoma.  He has had recurrent left lower extremity swelling. Duplex study on 10/10/2014 revealed no evidence of DVT CT abdomen and pelvis from 03/23/2015 showed no evidence of lymphadenopathy. He is currently in complete remission  Left hip pain likely secondary to osteoarthritis, as detected on an x-ray from August 2016. Advised increasing activity as well as weight loss.   Patient concerned about overall cost of continued follow-ups. He states that he does follow with his primary care provider approximately every 6-12 months.  He is requesting that he performed his next follow-up in this office in approximately one year. Advised patient that we would need to evaluate labs in 6 months and continued to continue with close follow-up with his primary care provider between our visits.  Patient expressed understanding and was in agreement with this plan. He also understands that He can call clinic at any time with any questions, concerns, or complaints.  Dr. Mike Gip was available for consultation and review of plan of care for this patient.  Evlyn Kanner, NP  08/21/2015, 9:33 AM

## 2015-08-21 NOTE — Progress Notes (Signed)
Patient is here for a follow up, no complaints today.

## 2016-02-21 ENCOUNTER — Inpatient Hospital Stay: Payer: BLUE CROSS/BLUE SHIELD | Attending: Hematology and Oncology

## 2016-02-21 DIAGNOSIS — Z452 Encounter for adjustment and management of vascular access device: Secondary | ICD-10-CM | POA: Diagnosis not present

## 2016-02-21 DIAGNOSIS — C8105 Nodular lymphocyte predominant Hodgkin lymphoma, lymph nodes of inguinal region and lower limb: Secondary | ICD-10-CM

## 2016-02-21 DIAGNOSIS — C8100 Nodular lymphocyte predominant Hodgkin lymphoma, unspecified site: Secondary | ICD-10-CM | POA: Diagnosis present

## 2016-02-21 LAB — LACTATE DEHYDROGENASE: LDH: 98 U/L (ref 98–192)

## 2016-08-20 ENCOUNTER — Other Ambulatory Visit: Payer: BLUE CROSS/BLUE SHIELD

## 2016-08-20 ENCOUNTER — Ambulatory Visit: Payer: BLUE CROSS/BLUE SHIELD | Admitting: Hematology and Oncology

## 2016-09-19 ENCOUNTER — Inpatient Hospital Stay: Payer: BLUE CROSS/BLUE SHIELD

## 2016-09-19 ENCOUNTER — Inpatient Hospital Stay: Payer: BLUE CROSS/BLUE SHIELD | Admitting: Hematology and Oncology

## 2018-09-15 ENCOUNTER — Encounter
Admission: RE | Admit: 2018-09-15 | Discharge: 2018-09-15 | Disposition: A | Discharge status: Home / Self Care | Payer: Managed Care, Other (non HMO) | Source: Ambulatory Visit | Attending: Orthopedic Surgery | Admitting: Orthopedic Surgery

## 2018-09-15 ENCOUNTER — Other Ambulatory Visit: Payer: Self-pay

## 2018-09-15 DIAGNOSIS — I10 Essential (primary) hypertension: Secondary | ICD-10-CM | POA: Diagnosis not present

## 2018-09-15 DIAGNOSIS — Z20828 Contact with and (suspected) exposure to other viral communicable diseases: Secondary | ICD-10-CM | POA: Diagnosis not present

## 2018-09-15 DIAGNOSIS — R001 Bradycardia, unspecified: Secondary | ICD-10-CM | POA: Insufficient documentation

## 2018-09-15 DIAGNOSIS — Z01810 Encounter for preprocedural cardiovascular examination: Secondary | ICD-10-CM | POA: Diagnosis not present

## 2018-09-15 DIAGNOSIS — Z01818 Encounter for other preprocedural examination: Secondary | ICD-10-CM | POA: Diagnosis present

## 2018-09-15 DIAGNOSIS — I4891 Unspecified atrial fibrillation: Secondary | ICD-10-CM | POA: Diagnosis not present

## 2018-09-15 HISTORY — DX: Unspecified osteoarthritis, unspecified site: M19.90

## 2018-09-15 HISTORY — DX: Cardiac arrhythmia, unspecified: I49.9

## 2018-09-15 LAB — URINALYSIS, ROUTINE W REFLEX MICROSCOPIC
Bacteria, UA: NONE SEEN
Bilirubin Urine: NEGATIVE
Glucose, UA: NEGATIVE mg/dL
Ketones, ur: NEGATIVE mg/dL
Leukocytes,Ua: NEGATIVE
Nitrite: NEGATIVE
Protein, ur: NEGATIVE mg/dL
Specific Gravity, Urine: 1.015 (ref 1.005–1.030)
Squamous Epithelial / LPF: NONE SEEN (ref 0–5)
pH: 5 (ref 5.0–8.0)

## 2018-09-15 LAB — CBC
HCT: 44.4 % (ref 39.0–52.0)
Hemoglobin: 15.2 g/dL (ref 13.0–17.0)
MCH: 29.9 pg (ref 26.0–34.0)
MCHC: 34.2 g/dL (ref 30.0–36.0)
MCV: 87.2 fL (ref 80.0–100.0)
Platelets: 251 10*3/uL (ref 150–400)
RBC: 5.09 MIL/uL (ref 4.22–5.81)
RDW: 12.6 % (ref 11.5–15.5)
WBC: 6.8 10*3/uL (ref 4.0–10.5)
nRBC: 0 % (ref 0.0–0.2)

## 2018-09-15 LAB — BASIC METABOLIC PANEL
Anion gap: 10 (ref 5–15)
BUN: 10 mg/dL (ref 6–20)
CO2: 23 mmol/L (ref 22–32)
Calcium: 9.1 mg/dL (ref 8.9–10.3)
Chloride: 103 mmol/L (ref 98–111)
Creatinine, Ser: 0.62 mg/dL (ref 0.61–1.24)
GFR calc Af Amer: >60 mL/min (ref >60)
GFR calc non Af Amer: >60 mL/min (ref >60)
Glucose, Bld: 106 mg/dL — ABNORMAL HIGH (ref 70–99)
Potassium: 3.9 mmol/L (ref 3.5–5.1)
Sodium: 136 mmol/L (ref 135–145)

## 2018-09-15 LAB — SURGICAL PCR SCREEN
MRSA, PCR: NEGATIVE
Staphylococcus aureus: POSITIVE — AB

## 2018-09-15 LAB — TYPE AND SCREEN
ABO/RH(D): O POS
Antibody Screen: NEGATIVE
Extend sample reason: TRANSFUSED

## 2018-09-15 LAB — PROTIME-INR
INR: 1.5 — ABNORMAL HIGH (ref 0.8–1.2)
Prothrombin Time: 17.5 seconds — ABNORMAL HIGH (ref 11.4–15.2)

## 2018-09-15 LAB — SEDIMENTATION RATE: Sed Rate: 13 mm/hr (ref 0–20)

## 2018-09-15 LAB — APTT: aPTT: 36 seconds (ref 24–36)

## 2018-09-15 NOTE — Patient Instructions (Signed)
Your procedure is scheduled on: 09-22-18 TUESDAY Report to Same Day Surgery 2nd floor medical mall [facility] Entrance-take elevator on left to 2nd floor.  Check in with surgery information desk.) To find out your arrival time please call [phone] between 1PM - 3PM on 09-21-18 MONDAY  Remember: Instructions that are not followed completely may result in serious medical risk, up to and including death, or upon the discretion of your surgeon and anesthesiologist your surgery may need to be rescheduled.    _x___ 1. Do not eat food after midnight the night before your procedure. NO GUM OR CANDY AFTER MIDNIGHT. You may drink WATER up to 2 hours before you are scheduled to arrive at the hospital for your procedure.  Do not drink WATER within 2 hours of your scheduled arrival to the hospital.  Type 1 and type 2 diabetics should only drink water.   ____Ensure clear carbohydrate drink on the way to the hospital for bariatric patients  ____Ensure clear carbohydrate drink 3 hours before surgery.      __x__ 2. No Alcohol for 24 hours before or after surgery.   __x__3. No Smoking or e-cigarettes for 24 prior to surgery.  Do not use any chewable tobacco products for at least 6 hour prior to surgery   ____  4. Bring all medications with you on the day of surgery if instructed.    __x__ 5. Notify your doctor if there is any change in your medical condition     (cold, fever, infections).    x___6. On the morning of surgery brush your teeth with toothpaste and water.  You may rinse your mouth with mouth wash if you wish.  Do not swallow any toothpaste or mouthwash.   Do not wear jewelry, make-up, hairpins, clips or nail polish.  Do not wear lotions, powders, or perfumes. You may wear deodorant.  Do not shave 48 hours prior to surgery. Men may shave face and neck.  Do not bring valuables to the hospital.    [facility] is not responsible for any belongings or valuables.               Contacts,  dentures or bridgework may not be worn into surgery.  Leave your suitcase in the car. After surgery it may be brought to your room.  For patients admitted to the hospital, discharge time is determined by your treatment team.  _  Patients discharged the day of surgery will not be allowed to drive home.  You will need someone to drive you home and stay with you the night of your procedure.    Please read over the following fact sheets that you were given:   [facility] Preparing for Surgery and or MRSA Information   _x___ TAKE THE FOLLOWING MEDICATION THE MORNING OF SURGERY WITH A SMALL SIP OF WATER. These include:  1. METOPROLOL (TOPROL)  2. LIPITOR (ATORVASTATIN)  3.  4.  5.  6.  ____Fleets enema or Magnesium Citrate as directed.   _x___ Use CHG Soap or sage wipes as directed on instruction sheet   ____ Use inhalers on the day of surgery and bring to hospital day of surgery  _X___ Stop Metformin 2 days prior to surgery-LAST DOSE ON Saturday AUGUST 8TH    ____ Take 1/2 of usual insulin dose the night before surgery and none on the morning surgery.   _x___ Follow recommendations from Cardiologist, Pulmonologist or PCP regarding stopping Aspirin, Coumadin, Plavix ,Eliquis, Effient, or Pradaxa, and  Pletal-ASK DR [facility] ABOUT WHEN TO STOP XARELTO  X____Stop Anti-inflammatories such as Advil, Aleve, Ibuprofen, Motrin, Naproxen, Naprosyn, Goodies powders or aspirin products NOW-OK to take Tylenol   ____ Stop supplements until after surgery   ____ Bring C-Pap to the hospital.

## 2018-09-16 LAB — URINE CULTURE: Culture: NO GROWTH

## 2018-09-18 ENCOUNTER — Other Ambulatory Visit: Payer: Self-pay

## 2018-09-18 ENCOUNTER — Other Ambulatory Visit
Admission: RE | Admit: 2018-09-18 | Discharge: 2018-09-18 | Disposition: A | Discharge status: Home / Self Care | Payer: Managed Care, Other (non HMO) | Source: Ambulatory Visit | Attending: Orthopedic Surgery | Admitting: Orthopedic Surgery

## 2018-09-18 DIAGNOSIS — Z01818 Encounter for other preprocedural examination: Secondary | ICD-10-CM | POA: Diagnosis not present

## 2018-09-19 LAB — SARS CORONAVIRUS 2 (TAT 6-24 HRS): SARS Coronavirus 2: NEGATIVE

## 2018-09-21 ENCOUNTER — Encounter: Payer: Self-pay | Admitting: *Deleted

## 2018-09-21 MED ORDER — DEXTROSE 5 % IV SOLN
3.0000 g | Freq: Once | INTRAVENOUS | Status: AC
  Administered 2018-09-22: 3 g via INTRAVENOUS
  Filled 2018-09-21: qty 3

## 2018-09-21 MED ORDER — TRANEXAMIC ACID-NACL 1000-0.7 MG/100ML-% IV SOLN
1000.0000 mg | INTRAVENOUS | Status: AC
  Administered 2018-09-22: 1000 mg via INTRAVENOUS
  Filled 2018-09-21: qty 100

## 2018-09-21 NOTE — Pre-Procedure Instructions (Signed)
Progress Notes - documented in this encounter Harrold Donath, MD - 08/25/2018 9:15 AM EDT Formatting of this note might be different from the original. Colton Choi is a 53 y.o. male here for follow up of their medical problems  CHIEF COMPLAINT:  Follow up medical problems in the problem list and as discussed in the history and assessment areas as well as new complaints as listed.   Patient Active Problem List  Diagnosis  . Morbid obesity with BMI of 50.0-59.9, adult (CMS-HCC)  . Diabetes mellitus with stage 2 chronic kidney disease (CMS-HCC)  . Combined hyperlipidemia  . HTN (hypertension), benign  . Osteoporosis  . 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 Healthcare maintenance  . A-fib (CMS-HCC)  . Use of cane as ambulatory aid   HISTORY OF PRESENT ILLNESS:  Combined hyperlipidemia Healthy fat diet is being followed and no myalgia's are noted.   Morbid obesity with BMI of 50.0-59.9, adult The patient states they are working on activity levels and healthy diet.   Diabetes mellitus with stage 2 chronic kidney disease Following a diabetic diet and medications are tolerated as appropriate. Uremic symptoms such as nausea and itching are not noted and nephrotoxic medications are being avoided.   A-fib (CMS-HCC) Hr is controlled and no bleeding on xarelto is noted  HTN (hypertension), benign Taking medications without noted side effects or dizziness.   Use of cane as ambulatory aid Has today   Past Medical History:  Diagnosis Date  . Diabetes mellitus type 2, uncomplicated (CMS-HCC)  . Hyperglycemia  . Hypertension  . Hypertriglyceridemia   History reviewed. No pertinent surgical history.  No fever chills or sweats No nausea, vomiting or diarrhea No chest pain, shortness of breath   Social History   Socioeconomic History  . Marital status: Single  Spouse name: Not on file  . Number of children: Not on  file  . Years of education: Not on file  . Highest education level: Not on file  Occupational History  . Not on file  Social Needs  . Financial resource strain: Not on file  . Food insecurity:  Worry: Not on file  Inability: Not on file  . Transportation needs:  Medical: Not on file  Non-medical: Not on file  Tobacco Use  . Smoking status: Former Smoker  Packs/day: 1.00  Years: 20.00  Pack years: 20.00  Last attempt to quit: 05/04/2012  Years since quitting: 6.3  . Smokeless tobacco: Former Network engineer and Sexual Activity  . Alcohol use: Yes  Alcohol/week: 5.0 standard drinks  Types: 5 Standard drinks or equivalent per week  . Drug use: Not on file  . Sexual activity: Not on file  Lifestyle  . Physical activity:  Days per week: Not on file  Minutes per session: Not on file  . Stress: Not on file  Relationships  . Social connections:  Talks on phone: Not on file  Gets together: Not on file  Attends religious service: Not on file  Active member of club or organization: Not on file  Attends meetings of clubs or organizations: Not on file  Relationship status: Not on file  Other Topics Concern  . Not on file  Social History Narrative  . Not on file     Current Outpatient Medications:  . aspirin 81 MG EC tablet, Take 81 mg by mouth once daily., Disp: , Rfl:  . atorvastatin (LIPITOR) 40 MG tablet, Take 1 tablet (40 mg  total) by mouth once daily, Disp: 90 tablet, Rfl: 11 . glimepiride (AMARYL) 2 MG tablet, Take 1 tablet (2 mg total) by mouth daily with breakfast, Disp: 90 tablet, Rfl: 1 . lisinopriL-hydrochlorothiazide (ZESTORETIC) 20-12.5 mg tablet, Take 1 tablet by mouth once daily, Disp: 90 tablet, Rfl: 1 . metFORMIN (GLUCOPHAGE-XR) 500 MG XR tablet, Take 2 tablets (1,000 mg total) by mouth daily with dinner, Disp: 180 tablet, Rfl: 1 . metoprolol succinate (TOPROL-XL) 200 MG XL tablet, Take 1 tablet (200 mg total) by mouth once daily, Disp: 90 tablet, Rfl: 11 .  rivaroxaban (XARELTO) 20 mg tablet, Take 1 tablet (20 mg total) by mouth once daily, Disp: 30 tablet, Rfl: 11  Vitals:  08/25/18 0901  BP: 140/79  Pulse: 60  Body mass index is 50.2 kg/m. No acute distress Lungs; clear to ascultation Heart; Regular rate and rhythm  Abdomen; Soft and flat, normal bowel sounds Extremities; No clubbing, cyanosis or edema  No visits with results within 3 Month(s) from this visit.  Latest known visit with results is:  Office Visit on 12/24/2017  Component Date Value Ref Range Status  . Vent Rate (bpm) 12/24/2017 118 Final  . QRS Interval (msec) 12/24/2017 88 Final  . QT Interval (msec) 12/24/2017 302 Final  . QTc (msec) 12/24/2017 423 Final   ASSESSMENT AND PLAN:  Diagnoses and all orders for this visit:  Diabetes mellitus with stage 2 chronic kidney disease (CMS-HCC) - Basic Metabolic Panel (BMP); Future - Hemoglobin A1C; Future - Hepatic Function Panel (HFP); Future - Lipid Panel w/calc LDL; Future - Microalbumin, Random Urine; Future  HTN (hypertension), benign - Basic Metabolic Panel (BMP); Future - Hemoglobin A1C; Future - Hepatic Function Panel (HFP); Future - Lipid Panel w/calc LDL; Future - Microalbumin, Random Urine; Future  Persistent atrial fibrillation (CMS-HCC)  Morbid obesity with BMI of 50.0-59.9, adult (CMS-HCC)  Combined hyperlipidemia - Basic Metabolic Panel (BMP); Future - Hemoglobin A1C; Future - Hepatic Function Panel (HFP); Future - Lipid Panel w/calc LDL; Future - Microalbumin, Random Urine; Future  Use of cane as ambulatory aid  Hip pain, right - Ambulatory Referral to Orthopedic Surgery  All listed problems are stable and associated medications and orders are noted. Will schedule a follow up appointment.     Electronically signed by Harrold Donath, MD at 08/25/2018 9:27 AM EDT   Miscellaneous Notes - documented in this encounter Table of Contents for Miscellaneous Notes  Assessment  & Plan Note - Harrold Donath, MD - 08/25/2018 9:18 AM EDT  Assessment & Plan Note - Harrold Donath, MD - 08/25/2018 9:16 AM EDT  Assessment & Plan Note - Harrold Donath, MD - 08/25/2018 9:16 AM EDT  Assessment & Plan Note - Harrold Donath, MD - 08/25/2018 9:15 AM EDT  Assessment & Plan Note - Harrold Donath, MD - 08/25/2018 9:15 AM EDT  Assessment & Plan Note - Harrold Donath, MD - 08/25/2018 9:15 AM EDT    Assessment & Plan Note - Harrold Donath, MD - 08/25/2018 9:18 AM EDT Associated Problem(s): Use of cane as ambulatory aid  Has today   Electronically signed by Harrold Donath, MD at 08/25/2018 9:18 AM EDT  Back to top of Miscellaneous Notes Assessment & Plan Note - Harrold Donath, MD - 08/25/2018 9:16 AM EDT Associated Problem(s): HTN (hypertension), benign  Taking medications without noted side effects or dizziness.   Electronically signed by Frazier Richards  Hassan Buckler, MD at 08/25/2018 9:16 AM EDT  Back to top of Miscellaneous Notes Assessment & Plan Note - Harrold Donath, MD - 08/25/2018 9:16 AM EDT Associated Problem(s): A-fib (CMS-HCC)  Hr is controlled and no bleeding on xarelto is noted  Electronically signed by Harrold Donath, MD at 08/25/2018 9:16 AM EDT  Back to top of Miscellaneous Notes Assessment & Plan Note - Harrold Donath, MD - 08/25/2018 9:15 AM EDT Associated Problem(s): Diabetes mellitus with stage 2 chronic kidney disease (CMS-HCC)  Following a diabetic diet and medications are tolerated as appropriate. Uremic symptoms such as nausea and itching are not noted and nephrotoxic medications are being avoided.   Electronically signed by Harrold Donath, MD at 08/25/2018 9:15 AM EDT  Back to top of Miscellaneous Notes Assessment & Plan Note - Harrold Donath, MD - 08/25/2018 9:15 AM EDT Associated Problem(s): Morbid obesity with BMI of 50.0-59.9, adult (CMS-HCC)  The patient states they are working on activity levels and healthy diet.   Electronically signed by Harrold Donath, MD at 08/25/2018 9:15 AM EDT  Back to top of Miscellaneous Notes Assessment & Plan Note - Harrold Donath, MD - 08/25/2018 9:15 AM EDT Associated Problem(s): Combined hyperlipidemia  Healthy fat diet is being followed and no myalgia's are noted.   Electronically signed by Harrold Donath, MD at 08/25/2018 9:15 AM EDT  Back to top of Miscellaneous Notes  Plan of Treatment - documented as of this encounter Upcoming Encounters Upcoming Encounters  Date Type Specialty Care Team Description  10/09/2018 [place], [place], [place], [place] [address]  [phone]  [phone] (Fax)    11/04/2018 Post Op Orthopaedics Lauris Poag, MD  [address]  [facility] [place], [place] [address]  [phone]  [phone] (Fax)    12/22/2018 Ancillary Orders Lab Harrold Donath, MD  [place]  [facility] [place], [place] [address]  [phone]  [phone] (Fax)    12/29/2018 Office Visit Internal Medicine Harrold Donath, MD  [place]  [facility] [place], [place] [address]  [phone]  [phone] (Fax)     Scheduled Orders Scheduled Orders  Name Type Priority Associated Diagnoses Order Schedule  Basic Metabolic Panel (BMP) Lab Routine Diabetes mellitus with stage 2 chronic kidney disease (CMS-HCC)  HTN (hypertension), benign  Combined hyperlipidemia  Expected: 12/27/2018 (Approximate), Expires: 02/01/2019  Hemoglobin A1C Lab Routine Diabetes mellitus with stage 2 chronic kidney disease (CMS-HCC)  HTN (hypertension), benign   Combined hyperlipidemia  Expected: 12/27/2018 (Approximate), Expires: 03/13/2019  Hepatic Function Panel (HFP) Lab Routine Diabetes mellitus with stage 2 chronic kidney disease (CMS-HCC)  HTN (hypertension), benign  Combined hyperlipidemia  Expected: 12/27/2018 (Approximate), Expires: 03/13/2019  Lipid Panel w/calc LDL Lab Routine Diabetes mellitus with stage 2 chronic kidney disease (CMS-HCC)  HTN (hypertension), benign  Combined hyperlipidemia  Expected: 12/27/2018 (Approximate), Expires: 03/13/2019  Microalbumin, Random Urine Lab Routine Diabetes mellitus with stage 2 chronic kidney disease (CMS-HCC)  HTN (hypertension), benign  Combined hyperlipidemia  Expected: 12/27/2018 (Approximate), Expires: 03/13/2019   Scheduled Referrals Scheduled Referrals  Name Type Priority Associated Diagnoses Order Schedule  Ambulatory Referral to Orthopedic Surgery Outpatient Referral Routine Hip pain, right  Ordered: 08/25/2018   Goals - documented as of this encounter Goal Patient Goal Type Associated Problems Recent Progress Patient-Stated?  Author  Maintain health/healthy lifestyle  Lifestyle   No Stegall, Amy, CMA  Note:   *Eat less Salt    Maintain health/healthy lifestyle  Lifestyle   Yes Stegall, Amy, CMA   Visit Diagnoses - documented in this encounter Diagnosis  Diabetes mellitus with stage 2 chronic kidney disease (CMS-HCC) - Primary   HTN (hypertension), benign  Essential hypertension, benign   Persistent atrial fibrillation (CMS-HCC)  Atrial fibrillation   Morbid obesity with BMI of 50.0-59.9, adult (CMS-HCC)   Combined hyperlipidemia  Other and unspecified hyperlipidemia   Use of cane as ambulatory aid   Hip pain, right  Pain in joint, pelvic region and thigh   Images  Patient Contacts  Contact Name Contact Address Communication Relationship to Patient  Waunita Schooner Unknown [phone] (Mobile) Other, Emergency Contact  Document Information   Primary Care Provider Other Service Providers Document Coverage Dates  Caleen Essex, MD (Oct. 28, 2015October 28, 2015 - Present) DM: [phone] (Work) [phone] (Fax) [facility] [place], [place] [address] Internal Medicine [facility] [place], [place] [address] Lacy Duverney, MD (Referring Physician) DM: [phone] (Work) [phone] (Fax)  Pediatric Hematology and Oncology Brooke Dare, MD (Consulting Provider) DM: [phone] (Work) [phone] (Fax)  Oncology Jul. 14, 2020July 14, 2020   [place] [address] [place], [place] [address]   Encounter Providers Encounter Date  Caleen Essex, MD (Attending) DM: [phone] (Work) [phone] (Fax) [facility] [place], [place] [address] Internal Medicine Jul. 14, 2020July 14, 2020

## 2018-09-22 ENCOUNTER — Encounter
Admission: RE | Disposition: A | Discharge status: Home Health | Payer: Self-pay | Source: Home / Self Care | Attending: Orthopedic Surgery

## 2018-09-22 ENCOUNTER — Inpatient Hospital Stay: Payer: Managed Care, Other (non HMO) | Admitting: Certified Registered Nurse Anesthetist

## 2018-09-22 ENCOUNTER — Inpatient Hospital Stay
Admission: RE | Admit: 2018-09-22 | Discharge: 2018-09-25 | DRG: 470 | Disposition: A | Discharge status: Home Health | Payer: Managed Care, Other (non HMO) | Attending: Orthopedic Surgery | Admitting: Orthopedic Surgery

## 2018-09-22 ENCOUNTER — Inpatient Hospital Stay: Payer: Managed Care, Other (non HMO)

## 2018-09-22 ENCOUNTER — Encounter: Payer: Self-pay | Admitting: Certified Registered Nurse Anesthetist

## 2018-09-22 ENCOUNTER — Other Ambulatory Visit: Payer: Self-pay

## 2018-09-22 DIAGNOSIS — Z6841 Body Mass Index (BMI) 40.0 and over, adult: Secondary | ICD-10-CM | POA: Diagnosis not present

## 2018-09-22 DIAGNOSIS — N189 Chronic kidney disease, unspecified: Secondary | ICD-10-CM | POA: Diagnosis present

## 2018-09-22 DIAGNOSIS — Z7982 Long term (current) use of aspirin: Secondary | ICD-10-CM

## 2018-09-22 DIAGNOSIS — M109 Gout, unspecified: Secondary | ICD-10-CM | POA: Diagnosis present

## 2018-09-22 DIAGNOSIS — M1612 Unilateral primary osteoarthritis, left hip: Secondary | ICD-10-CM | POA: Diagnosis present

## 2018-09-22 DIAGNOSIS — Z79899 Other long term (current) drug therapy: Secondary | ICD-10-CM

## 2018-09-22 DIAGNOSIS — Z7901 Long term (current) use of anticoagulants: Secondary | ICD-10-CM | POA: Diagnosis not present

## 2018-09-22 DIAGNOSIS — G8918 Other acute postprocedural pain: Secondary | ICD-10-CM

## 2018-09-22 DIAGNOSIS — I4891 Unspecified atrial fibrillation: Secondary | ICD-10-CM | POA: Diagnosis present

## 2018-09-22 DIAGNOSIS — Z7984 Long term (current) use of oral hypoglycemic drugs: Secondary | ICD-10-CM

## 2018-09-22 DIAGNOSIS — Z419 Encounter for procedure for purposes other than remedying health state, unspecified: Secondary | ICD-10-CM

## 2018-09-22 DIAGNOSIS — E785 Hyperlipidemia, unspecified: Secondary | ICD-10-CM | POA: Diagnosis present

## 2018-09-22 DIAGNOSIS — E1122 Type 2 diabetes mellitus with diabetic chronic kidney disease: Secondary | ICD-10-CM | POA: Diagnosis present

## 2018-09-22 DIAGNOSIS — Z96642 Presence of left artificial hip joint: Secondary | ICD-10-CM

## 2018-09-22 DIAGNOSIS — Z8572 Personal history of non-Hodgkin lymphomas: Secondary | ICD-10-CM

## 2018-09-22 DIAGNOSIS — I129 Hypertensive chronic kidney disease with stage 1 through stage 4 chronic kidney disease, or unspecified chronic kidney disease: Secondary | ICD-10-CM | POA: Diagnosis present

## 2018-09-22 HISTORY — DX: Unspecified atrial fibrillation: I48.91

## 2018-09-22 HISTORY — PX: TOTAL HIP ARTHROPLASTY: SHX124

## 2018-09-22 LAB — HEMOGLOBIN A1C
Hgb A1c MFr Bld: 7.4 % — ABNORMAL HIGH (ref 4.8–5.6)
Mean Plasma Glucose: 165.68 mg/dL

## 2018-09-22 LAB — GLUCOSE, CAPILLARY
Glucose-Capillary: 160 mg/dL — ABNORMAL HIGH (ref 70–99)
Glucose-Capillary: 203 mg/dL — ABNORMAL HIGH (ref 70–99)
Glucose-Capillary: 313 mg/dL — ABNORMAL HIGH (ref 70–99)
Glucose-Capillary: 297 mg/dL — ABNORMAL HIGH (ref 70–99)

## 2018-09-22 LAB — TYPE AND SCREEN
ABO/RH(D): O POS
Antibody Screen: NEGATIVE

## 2018-09-22 SURGERY — ARTHROPLASTY, HIP, TOTAL, ANTERIOR APPROACH
Anesthesia: General | Site: Hip | Laterality: Left

## 2018-09-22 MED ORDER — ROCURONIUM BROMIDE 50 MG/5ML IV SOLN
INTRAVENOUS | Status: AC
  Filled 2018-09-22: qty 1

## 2018-09-22 MED ORDER — METHOCARBAMOL 500 MG PO TABS: 500.0000 mg | ORAL_TABLET | Freq: Four times a day (QID) | ORAL | Status: DC | PRN

## 2018-09-22 MED ORDER — LIDOCAINE HCL (PF) 2 % IJ SOLN
INTRAMUSCULAR | Status: AC
  Filled 2018-09-22: qty 10

## 2018-09-22 MED ORDER — NEOMYCIN-POLYMYXIN B GU 40-200000 IR SOLN
Status: DC | PRN
  Administered 2018-09-22: 4 mL

## 2018-09-22 MED ORDER — METOCLOPRAMIDE HCL 5 MG/ML IJ SOLN: 5.0000 mg | Freq: Three times a day (TID) | INTRAMUSCULAR | Status: DC | PRN

## 2018-09-22 MED ORDER — OXYCODONE HCL 5 MG/5ML PO SOLN: 5.0000 mg | Freq: Once | ORAL | Status: DC | PRN

## 2018-09-22 MED ORDER — ACETAMINOPHEN 10 MG/ML IV SOLN
INTRAVENOUS | Status: DC | PRN
  Administered 2018-09-22: 1000 mg via INTRAVENOUS

## 2018-09-22 MED ORDER — PHENOL 1.4 % MT LIQD
1.0000 | OROMUCOSAL | Status: DC | PRN
  Administered 2018-09-24: 1 via OROMUCOSAL
  Filled 2018-09-22 (×2): qty 177

## 2018-09-22 MED ORDER — DOCUSATE SODIUM 100 MG PO CAPS
100.0000 mg | ORAL_CAPSULE | Freq: Two times a day (BID) | ORAL | Status: DC
  Administered 2018-09-22 – 2018-09-25 (×6): 100 mg via ORAL
  Filled 2018-09-22 (×6): qty 1

## 2018-09-22 MED ORDER — SODIUM CHLORIDE 0.9 % IV SOLN
INTRAVENOUS | Status: DC | PRN
  Administered 2018-09-22: 30 ug/min via INTRAVENOUS

## 2018-09-22 MED ORDER — HYDROCODONE-ACETAMINOPHEN 7.5-325 MG PO TABS: 1.0000 | ORAL_TABLET | ORAL | Status: DC | PRN

## 2018-09-22 MED ORDER — PROMETHAZINE HCL 25 MG/ML IJ SOLN: 6.2500 mg | INTRAMUSCULAR | Status: DC | PRN

## 2018-09-22 MED ORDER — BISACODYL 5 MG PO TBEC
5.0000 mg | DELAYED_RELEASE_TABLET | Freq: Every day | ORAL | Status: DC | PRN
  Administered 2018-09-24 – 2018-09-25 (×2): 5 mg via ORAL
  Filled 2018-09-22 (×2): qty 1

## 2018-09-22 MED ORDER — LISINOPRIL 20 MG PO TABS
20.0000 mg | ORAL_TABLET | Freq: Every day | ORAL | Status: DC
  Administered 2018-09-23 – 2018-09-25 (×2): 20 mg via ORAL
  Filled 2018-09-22 (×2): qty 1

## 2018-09-22 MED ORDER — PROPOFOL 10 MG/ML IV BOLUS
INTRAVENOUS | Status: AC
  Filled 2018-09-22: qty 20

## 2018-09-22 MED ORDER — HYDROCODONE-ACETAMINOPHEN 5-325 MG PO TABS
1.0000 | ORAL_TABLET | ORAL | Status: DC | PRN
  Administered 2018-09-22 – 2018-09-24 (×7): 1 via ORAL
  Administered 2018-09-24: 2 via ORAL
  Administered 2018-09-25: 1 via ORAL
  Filled 2018-09-22: qty 2
  Filled 2018-09-22 (×9): qty 1

## 2018-09-22 MED ORDER — METHOCARBAMOL 1000 MG/10ML IJ SOLN
500.0000 mg | Freq: Four times a day (QID) | INTRAVENOUS | Status: DC | PRN
  Filled 2018-09-22: qty 5

## 2018-09-22 MED ORDER — ACETAMINOPHEN 10 MG/ML IV SOLN
INTRAVENOUS | Status: AC
  Filled 2018-09-22: qty 100

## 2018-09-22 MED ORDER — METFORMIN HCL 500 MG PO TABS
1000.0000 mg | ORAL_TABLET | Freq: Every day | ORAL | Status: DC
  Administered 2018-09-23 – 2018-09-25 (×3): 1000 mg via ORAL
  Filled 2018-09-22 (×3): qty 2

## 2018-09-22 MED ORDER — DIPHENHYDRAMINE HCL 12.5 MG/5ML PO ELIX: 12.5000 mg | ORAL_SOLUTION | ORAL | Status: DC | PRN

## 2018-09-22 MED ORDER — SUGAMMADEX SODIUM 500 MG/5ML IV SOLN
INTRAVENOUS | Status: AC
  Filled 2018-09-22: qty 5

## 2018-09-22 MED ORDER — MEPERIDINE HCL 50 MG/ML IJ SOLN: 6.2500 mg | INTRAMUSCULAR | Status: DC | PRN

## 2018-09-22 MED ORDER — SODIUM CHLORIDE 0.9 % IV SOLN
INTRAVENOUS | Status: DC
  Administered 2018-09-22: 14:00:00 via INTRAVENOUS

## 2018-09-22 MED ORDER — GLIMEPIRIDE 2 MG PO TABS
2.0000 mg | ORAL_TABLET | Freq: Every day | ORAL | Status: DC
  Administered 2018-09-23 – 2018-09-25 (×3): 2 mg via ORAL
  Filled 2018-09-22 (×4): qty 1

## 2018-09-22 MED ORDER — MENTHOL 3 MG MT LOZG
1.0000 | LOZENGE | OROMUCOSAL | Status: DC | PRN
  Filled 2018-09-22: qty 9

## 2018-09-22 MED ORDER — SODIUM CHLORIDE 0.9 % IV SOLN
INTRAVENOUS | Status: DC
  Administered 2018-09-22: 09:00:00 via INTRAVENOUS

## 2018-09-22 MED ORDER — TRAMADOL HCL 50 MG PO TABS
50.0000 mg | ORAL_TABLET | Freq: Four times a day (QID) | ORAL | Status: DC
  Administered 2018-09-22 – 2018-09-25 (×11): 50 mg via ORAL
  Filled 2018-09-22 (×11): qty 1

## 2018-09-22 MED ORDER — ATORVASTATIN CALCIUM 20 MG PO TABS
40.0000 mg | ORAL_TABLET | ORAL | Status: DC
  Administered 2018-09-23 – 2018-09-25 (×3): 40 mg via ORAL
  Filled 2018-09-22 (×3): qty 2

## 2018-09-22 MED ORDER — KETAMINE HCL 50 MG/ML IJ SOLN
INTRAMUSCULAR | Status: AC
  Filled 2018-09-22: qty 10

## 2018-09-22 MED ORDER — DEXAMETHASONE SODIUM PHOSPHATE 10 MG/ML IJ SOLN
INTRAMUSCULAR | Status: AC
  Filled 2018-09-22: qty 1

## 2018-09-22 MED ORDER — HYDROCHLOROTHIAZIDE 12.5 MG PO CAPS
12.5000 mg | ORAL_CAPSULE | Freq: Every day | ORAL | Status: DC
  Administered 2018-09-23: 12.5 mg via ORAL
  Filled 2018-09-22: qty 1

## 2018-09-22 MED ORDER — LIDOCAINE HCL 4 % MT SOLN
OROMUCOSAL | Status: DC | PRN
  Administered 2018-09-22: 4 mL via TOPICAL

## 2018-09-22 MED ORDER — LIDOCAINE HCL (CARDIAC) PF 100 MG/5ML IV SOSY
PREFILLED_SYRINGE | INTRAVENOUS | Status: DC | PRN
  Administered 2018-09-22: 100 mg via INTRAVENOUS

## 2018-09-22 MED ORDER — PANTOPRAZOLE SODIUM 40 MG PO TBEC
40.0000 mg | DELAYED_RELEASE_TABLET | Freq: Every day | ORAL | Status: DC
  Administered 2018-09-23 – 2018-09-25 (×3): 40 mg via ORAL
  Filled 2018-09-22 (×3): qty 1

## 2018-09-22 MED ORDER — MIDAZOLAM HCL 2 MG/2ML IJ SOLN
INTRAMUSCULAR | Status: AC
  Filled 2018-09-22: qty 2

## 2018-09-22 MED ORDER — PHENYLEPHRINE HCL (PRESSORS) 10 MG/ML IV SOLN
INTRAVENOUS | Status: DC | PRN
  Administered 2018-09-22: 100 ug via INTRAVENOUS

## 2018-09-22 MED ORDER — LISINOPRIL-HYDROCHLOROTHIAZIDE 20-12.5 MG PO TABS: 1.0000 | ORAL_TABLET | ORAL | Status: DC

## 2018-09-22 MED ORDER — SUCCINYLCHOLINE CHLORIDE 20 MG/ML IJ SOLN
INTRAMUSCULAR | Status: DC | PRN
  Administered 2018-09-22: 180 mg via INTRAVENOUS

## 2018-09-22 MED ORDER — FAMOTIDINE 20 MG PO TABS
ORAL_TABLET | ORAL | Status: AC
  Filled 2018-09-22: qty 1

## 2018-09-22 MED ORDER — FENTANYL CITRATE (PF) 250 MCG/5ML IJ SOLN
INTRAMUSCULAR | Status: AC
  Filled 2018-09-22: qty 5

## 2018-09-22 MED ORDER — CEFAZOLIN SODIUM-DEXTROSE 2-4 GM/100ML-% IV SOLN
2.0000 g | Freq: Four times a day (QID) | INTRAVENOUS | Status: AC
  Administered 2018-09-22 – 2018-09-23 (×3): 2 g via INTRAVENOUS
  Filled 2018-09-22 (×3): qty 100

## 2018-09-22 MED ORDER — FENTANYL CITRATE (PF) 100 MCG/2ML IJ SOLN
25.0000 ug | INTRAMUSCULAR | Status: DC | PRN
  Administered 2018-09-22 (×4): 25 ug via INTRAVENOUS

## 2018-09-22 MED ORDER — EPHEDRINE SULFATE 50 MG/ML IJ SOLN
INTRAMUSCULAR | Status: DC | PRN
  Administered 2018-09-22: 20 mg via INTRAVENOUS

## 2018-09-22 MED ORDER — ONDANSETRON HCL 4 MG/2ML IJ SOLN
INTRAMUSCULAR | Status: DC | PRN
  Administered 2018-09-22: 4 mg via INTRAVENOUS

## 2018-09-22 MED ORDER — ALUM & MAG HYDROXIDE-SIMETH 200-200-20 MG/5ML PO SUSP
30.0000 mL | ORAL | Status: DC | PRN
  Administered 2018-09-24: 30 mL via ORAL
  Filled 2018-09-22: qty 30

## 2018-09-22 MED ORDER — SODIUM CHLORIDE 0.9 % IV SOLN
INTRAVENOUS | Status: DC | PRN
  Administered 2018-09-22: 60 mL

## 2018-09-22 MED ORDER — BUPIVACAINE-EPINEPHRINE 0.25% -1:200000 IJ SOLN
INTRAMUSCULAR | Status: DC | PRN
  Administered 2018-09-22: 30 mL

## 2018-09-22 MED ORDER — MAGNESIUM CITRATE PO SOLN
1.0000 | Freq: Once | ORAL | Status: DC | PRN
  Filled 2018-09-22: qty 296

## 2018-09-22 MED ORDER — ONDANSETRON HCL 4 MG PO TABS: 4.0000 mg | ORAL_TABLET | Freq: Four times a day (QID) | ORAL | Status: DC | PRN

## 2018-09-22 MED ORDER — MORPHINE SULFATE (PF) 2 MG/ML IV SOLN: 0.5000 mg | INTRAVENOUS | Status: DC | PRN

## 2018-09-22 MED ORDER — RIVAROXABAN 20 MG PO TABS
20.0000 mg | ORAL_TABLET | Freq: Every day | ORAL | Status: DC
  Administered 2018-09-23 – 2018-09-24 (×2): 20 mg via ORAL
  Filled 2018-09-22 (×3): qty 1

## 2018-09-22 MED ORDER — METOCLOPRAMIDE HCL 10 MG PO TABS: 5.0000 mg | ORAL_TABLET | Freq: Three times a day (TID) | ORAL | Status: DC | PRN

## 2018-09-22 MED ORDER — ZOLPIDEM TARTRATE 5 MG PO TABS: 5.0000 mg | ORAL_TABLET | Freq: Every evening | ORAL | Status: DC | PRN

## 2018-09-22 MED ORDER — FENTANYL CITRATE (PF) 100 MCG/2ML IJ SOLN
INTRAMUSCULAR | Status: DC | PRN
  Administered 2018-09-22: 50 ug via INTRAVENOUS
  Administered 2018-09-22: 200 ug via INTRAVENOUS

## 2018-09-22 MED ORDER — DEXAMETHASONE SODIUM PHOSPHATE 10 MG/ML IJ SOLN
INTRAMUSCULAR | Status: DC | PRN
  Administered 2018-09-22: 10 mg via INTRAVENOUS

## 2018-09-22 MED ORDER — METOPROLOL SUCCINATE ER 50 MG PO TB24
200.0000 mg | ORAL_TABLET | ORAL | Status: DC
  Administered 2018-09-23: 200 mg via ORAL
  Filled 2018-09-22 (×4): qty 4

## 2018-09-22 MED ORDER — KETAMINE HCL 50 MG/ML IJ SOLN
INTRAMUSCULAR | Status: DC | PRN
  Administered 2018-09-22: 50 mg via INTRAMUSCULAR

## 2018-09-22 MED ORDER — FAMOTIDINE 20 MG PO TABS
20.0000 mg | ORAL_TABLET | Freq: Once | ORAL | Status: AC
  Administered 2018-09-22: 20 mg via ORAL

## 2018-09-22 MED ORDER — ONDANSETRON HCL 4 MG/2ML IJ SOLN
INTRAMUSCULAR | Status: AC
  Filled 2018-09-22: qty 2

## 2018-09-22 MED ORDER — SUGAMMADEX SODIUM 200 MG/2ML IV SOLN
INTRAVENOUS | Status: DC | PRN
  Administered 2018-09-22: 500 mg via INTRAVENOUS

## 2018-09-22 MED ORDER — ONDANSETRON HCL 4 MG/2ML IJ SOLN: 4.0000 mg | Freq: Four times a day (QID) | INTRAMUSCULAR | Status: DC | PRN

## 2018-09-22 MED ORDER — MIDAZOLAM HCL 2 MG/2ML IJ SOLN
INTRAMUSCULAR | Status: DC | PRN
  Administered 2018-09-22: 2 mg via INTRAVENOUS

## 2018-09-22 MED ORDER — ROCURONIUM BROMIDE 100 MG/10ML IV SOLN
INTRAVENOUS | Status: DC | PRN
  Administered 2018-09-22: 30 mg via INTRAVENOUS
  Administered 2018-09-22: 50 mg via INTRAVENOUS
  Administered 2018-09-22: 20 mg via INTRAVENOUS

## 2018-09-22 MED ORDER — PROPOFOL 10 MG/ML IV BOLUS
INTRAVENOUS | Status: DC | PRN
  Administered 2018-09-22: 250 mg via INTRAVENOUS

## 2018-09-22 MED ORDER — SUCCINYLCHOLINE CHLORIDE 20 MG/ML IJ SOLN
INTRAMUSCULAR | Status: AC
  Filled 2018-09-22: qty 1

## 2018-09-22 MED ORDER — MAGNESIUM HYDROXIDE 400 MG/5ML PO SUSP
30.0000 mL | Freq: Every day | ORAL | Status: DC | PRN
  Administered 2018-09-24 – 2018-09-25 (×2): 30 mL via ORAL
  Filled 2018-09-22 (×3): qty 30

## 2018-09-22 MED ORDER — ACETAMINOPHEN 325 MG PO TABS: 325.0000 mg | ORAL_TABLET | Freq: Four times a day (QID) | ORAL | Status: DC | PRN

## 2018-09-22 MED ORDER — INSULIN ASPART 100 UNIT/ML ~~LOC~~ SOLN
0.0000 [IU] | Freq: Three times a day (TID) | SUBCUTANEOUS | Status: DC
  Administered 2018-09-22: 11 [IU] via SUBCUTANEOUS
  Administered 2018-09-23: 3 [IU] via SUBCUTANEOUS
  Administered 2018-09-23 (×2): 5 [IU] via SUBCUTANEOUS
  Administered 2018-09-24 – 2018-09-25 (×4): 3 [IU] via SUBCUTANEOUS
  Filled 2018-09-22 (×8): qty 1

## 2018-09-22 MED ORDER — OXYCODONE HCL 5 MG PO TABS: 5.0000 mg | ORAL_TABLET | Freq: Once | ORAL | Status: DC | PRN

## 2018-09-22 MED ORDER — FENTANYL CITRATE (PF) 100 MCG/2ML IJ SOLN
INTRAMUSCULAR | Status: AC
  Filled 2018-09-22: qty 2

## 2018-09-22 SURGICAL SUPPLY — 59 items
BLADE SAGITTAL AGGR TOOTH XLG (BLADE) ×3 IMPLANT
BNDG COHESIVE 6X5 TAN STRL LF (GAUZE/BANDAGES/DRESSINGS) ×9 IMPLANT
CANISTER SUCT 1200ML W/VALVE (MISCELLANEOUS) ×3 IMPLANT
CANISTER WOUND CARE 500ML ATS (WOUND CARE) ×3 IMPLANT
CHLORAPREP W/TINT 26 (MISCELLANEOUS) ×3 IMPLANT
COVER BACK TABLE REUSABLE LG (DRAPES) ×3 IMPLANT
COVER WAND RF STERILE (DRAPES) ×3 IMPLANT
CUP ACETAB VERSA DBL 28X58 DMI (Orthopedic Implant) ×3 IMPLANT
DRAPE 3/4 80X56 (DRAPES) ×9 IMPLANT
DRAPE C-ARM XRAY 36X54 (DRAPES) ×3 IMPLANT
DRAPE INCISE IOBAN 66X60 STRL (DRAPES) IMPLANT
DRAPE POUCH INSTRU U-SHP 10X18 (DRAPES) ×3 IMPLANT
DRESSING SURGICEL FIBRLLR 1X2 (HEMOSTASIS) ×2 IMPLANT
DRSG OPSITE POSTOP 4X8 (GAUZE/BANDAGES/DRESSINGS) ×6 IMPLANT
DRSG SURGICEL FIBRILLAR 1X2 (HEMOSTASIS) ×6
ELECT BLADE 6.5 EXT (BLADE) ×3 IMPLANT
ELECT REM PT RETURN 9FT ADLT (ELECTROSURGICAL) ×3
ELECTRODE REM PT RTRN 9FT ADLT (ELECTROSURGICAL) ×1 IMPLANT
GLOVE BIOGEL PI IND STRL 9 (GLOVE) ×1 IMPLANT
GLOVE BIOGEL PI INDICATOR 9 (GLOVE) ×2
GLOVE SURG SYN 9.0  PF PI (GLOVE) ×4
GLOVE SURG SYN 9.0 PF PI (GLOVE) ×2 IMPLANT
GOWN SRG 2XL LVL 4 RGLN SLV (GOWNS) ×1 IMPLANT
GOWN STRL NON-REIN 2XL LVL4 (GOWNS) ×2
GOWN STRL REUS W/ TWL LRG LVL3 (GOWN DISPOSABLE) ×1 IMPLANT
GOWN STRL REUS W/TWL LRG LVL3 (GOWN DISPOSABLE) ×2
HEAD FEMORAL 28MM SZ S (Head) ×3 IMPLANT
HEMOVAC 400CC 10FR (MISCELLANEOUS) IMPLANT
HOLDER FOLEY CATH W/STRAP (MISCELLANEOUS) ×3 IMPLANT
HOOD PEEL AWAY FLYTE STAYCOOL (MISCELLANEOUS) ×3 IMPLANT
KIT PREVENA INCISION MGT 13 (CANNISTER) ×3 IMPLANT
MAT ABSORB  FLUID 56X50 GRAY (MISCELLANEOUS) ×2
MAT ABSORB FLUID 56X50 GRAY (MISCELLANEOUS) ×1 IMPLANT
NDL SAFETY ECLIPSE 18X1.5 (NEEDLE) ×1 IMPLANT
NEEDLE HYPO 18GX1.5 SHARP (NEEDLE) ×2
NEEDLE SPNL 20GX3.5 QUINCKE YW (NEEDLE) ×6 IMPLANT
NS IRRIG 1000ML POUR BTL (IV SOLUTION) ×3 IMPLANT
PACK HIP COMPR (MISCELLANEOUS) ×3 IMPLANT
SCALPEL PROTECTED #10 DISP (BLADE) ×6 IMPLANT
SHELL ACETABULAR SZ0 58MM (Shell) ×3 IMPLANT
SOL PREP PVP 2OZ (MISCELLANEOUS) ×3
SOLUTION PREP PVP 2OZ (MISCELLANEOUS) ×1 IMPLANT
SPONGE DRAIN TRACH 4X4 STRL 2S (GAUZE/BANDAGES/DRESSINGS) ×3 IMPLANT
STAPLER SKIN PROX 35W (STAPLE) ×3 IMPLANT
STEM FEMORAL SZ3 LAT COLLARED (Stem) ×3 IMPLANT
STRAP SAFETY 5IN WIDE (MISCELLANEOUS) ×3 IMPLANT
SUT DVC 2 QUILL PDO  T11 36X36 (SUTURE) ×2
SUT DVC 2 QUILL PDO T11 36X36 (SUTURE) ×1 IMPLANT
SUT SILK 0 (SUTURE) ×2
SUT SILK 0 30XBRD TIE 6 (SUTURE) ×1 IMPLANT
SUT V-LOC 90 ABS DVC 3-0 CL (SUTURE) ×3 IMPLANT
SUT VIC AB 1 CT1 36 (SUTURE) ×3 IMPLANT
SYR 20ML LL LF (SYRINGE) ×3 IMPLANT
SYR 30ML LL (SYRINGE) ×3 IMPLANT
SYR 50ML LL SCALE MARK (SYRINGE) ×6 IMPLANT
SYR BULB IRRIG 60ML STRL (SYRINGE) ×3 IMPLANT
TAPE MICROFOAM 4IN (TAPE) ×3 IMPLANT
TOWEL OR 17X26 4PK STRL BLUE (TOWEL DISPOSABLE) ×3 IMPLANT
TRAY FOLEY MTR SLVR 16FR STAT (SET/KITS/TRAYS/PACK) ×3 IMPLANT

## 2018-09-22 NOTE — Anesthesia Post-op Follow-up Note (Signed)
Anesthesia QCDR form completed.        

## 2018-09-22 NOTE — Anesthesia Preprocedure Evaluation (Signed)
Anesthesia Evaluation  Patient identified by MRN, date of birth, ID band Patient awake    Reviewed: Allergy & Precautions, NPO status , Patient's Chart, lab work & pertinent test results  History of Anesthesia Complications Negative for: history of anesthetic complications  Airway Mallampati: II  TM Distance: >3 FB Neck ROM: Full    Dental  (+) Upper Dentures, Lower Dentures   Pulmonary neg pulmonary ROS, neg sleep apnea, neg COPD, former smoker,    breath sounds clear to auscultation- rhonchi (-) wheezing      Cardiovascular hypertension, Pt. on medications (-) CAD, (-) Past MI, (-) Cardiac Stents and (-) CABG + dysrhythmias Atrial Fibrillation  Rhythm:Regular Rate:Normal - Systolic murmurs and - Diastolic murmurs    Neuro/Psych neg Seizures negative neurological ROS  negative psych ROS   GI/Hepatic negative GI ROS, Neg liver ROS,   Endo/Other  diabetes, Oral Hypoglycemic Agents  Renal/GU negative Renal ROS     Musculoskeletal  (+) Arthritis ,   Abdominal (+) + obese,   Peds  Hematology negative hematology ROS (+)   Anesthesia Other Findings Past Medical History: No date: A-fib (HCC) No date: Arthritis 20 years ago: Broken ankle     Comment:  Right 2017: Cancer ([place])     Comment:  lymphoma No date: Chronic kidney disease No date: Diabetes mellitus without complication (HCC) No date: Dysrhythmia No date: Gout No date: Hyperlipidemia No date: Hypertension   Reproductive/Obstetrics                             Anesthesia Physical Anesthesia Plan  ASA: III  Anesthesia Plan: General   Post-op Pain Management:    Induction: Intravenous  PONV Risk Score and Plan: 1 and Ondansetron, Dexamethasone and Midazolam  Airway Management Planned: Oral ETT  Additional Equipment:   Intra-op Plan:   Post-operative Plan: Extubation in OR  Informed Consent: I have reviewed the patients  History and Physical, chart, labs and discussed the procedure including the risks, benefits and alternatives for the proposed anesthesia with the patient or authorized representative who has indicated his/her understanding and acceptance.     Dental advisory given  Plan Discussed with: CRNA and Anesthesiologist  Anesthesia Plan Comments:         Anesthesia Quick Evaluation

## 2018-09-22 NOTE — Transfer of Care (Signed)
Immediate Anesthesia Transfer of Care Note  Patient: Colton Choi  Procedure(s) Performed: TOTAL HIP ARTHROPLASTY ANTERIOR APPROACH (Left Hip)  Patient Location: PACU  Anesthesia Type:General  Level of Consciousness: awake and alert   Airway & Oxygen Therapy: Patient Spontanous Breathing and Patient connected to face mask oxygen  Post-op Assessment: Report given to RN and Post -op Vital signs reviewed and stable  Post vital signs: Reviewed and stable  Last Vitals:  Vitals Value Taken Time  BP 155/92 09/22/18 1132  Temp 36.6 C 09/22/18 1132  Pulse 67 09/22/18 1133  Resp 15 09/22/18 1133  SpO2 98 % 09/22/18 1133  Vitals shown include unvalidated device data.  Last Pain:  Vitals:   09/22/18 0809  TempSrc: Oral  PainSc: 10-Worst pain ever         Complications: No apparent anesthesia complications

## 2018-09-22 NOTE — Progress Notes (Signed)
15 minute call to floor. 

## 2018-09-22 NOTE — TOC Progression Note (Signed)
Transition of Care [facility]) - Progression Note    Patient Details  Name: Colton Choi MRN: [id number] Date of Birth: 08-04-1965  Transition of Care [facility]) CM/SW [place], RN Phone Number: 09/22/2018, 4:09 PM  Clinical Narrative:    Requested the price of Lovenox will notify the patient once obtained        Expected Discharge Plan and Services                                                 Social Determinants of Health (SDOH) Interventions    Readmission Risk Interventions No flowsheet data found.

## 2018-09-22 NOTE — Progress Notes (Signed)
Dentures returned to patient. 

## 2018-09-22 NOTE — Anesthesia Postprocedure Evaluation (Signed)
Anesthesia Post Note  Patient: Colton Choi  Procedure(s) Performed: TOTAL HIP ARTHROPLASTY ANTERIOR APPROACH (Left Hip)  Patient location during evaluation: PACU Anesthesia Type: General Level of consciousness: awake and alert and oriented Pain management: pain level controlled Vital Signs Assessment: post-procedure vital signs reviewed and stable Respiratory status: spontaneous breathing, nonlabored ventilation and respiratory function stable Cardiovascular status: blood pressure returned to baseline and stable Postop Assessment: no signs of nausea or vomiting Anesthetic complications: no     Last Vitals:  Vitals:   09/22/18 1209 09/22/18 1230  BP:  (!) 156/70  Pulse: 71 62  Resp: 12 16  Temp:  36.4 C  SpO2: 97% 96%    Last Pain:  Vitals:   09/22/18 1236  TempSrc:   PainSc: 8                  Taten Merrow

## 2018-09-22 NOTE — Evaluation (Signed)
Physical Therapy Evaluation Patient Details Name: Colton Choi MRN: [id number] DOB: [birth year] Today's Date: 09/22/2018   History of Present Illness  Pt is a 53 yo male diagnosed with OA of the left hip and is s/p elective L THA.  PMH includes: HTN, A-fib, R ankle fx, lymphoma, CKD, DM, and HLD.    Clinical Impression  Pt presents with deficits in strength, transfers, mobility, gait, balance, and activity tolerance.  Pt required min A with sit to sup for LLE control and SBA with cues for sequencing during sit to sup.  Pt able to perform sit to/from stand transfers from various height surfaces with CGA and cues for sequencing.  Pt was able to amb 2 x 5' with a RW with step-to sequencing and mod-max verbal and visual cues for proper sequencing. Pt education provided on PWB sequencing during forward, backward, and sideways amb with pt able to maintain PWB to the LLE throughout.  Pt able to demonstrate ability to amb with PWB status by taking first two steps with just the L toes on the floor.  Pt will benefit from HHPT services upon discharge to safely address above deficits for decreased caregiver assistance and eventual return to PLOF.      Follow Up Recommendations Home health PT    Equipment Recommendations  Other (comment)(Bariatric FWW)    Recommendations for Other Services       Precautions / Restrictions Precautions Precautions: Anterior Hip Restrictions Weight Bearing Restrictions: Yes LLE Weight Bearing: Partial weight bearing LLE Partial Weight Bearing Percentage or Pounds: 50%; per Dr. Rudene Christians ok for pt to be WBAT for stair training only but attempt to take as much weight off of the LLE as safely possible      Mobility  Bed Mobility Overal bed mobility: Needs Assistance Bed Mobility: Supine to Sit;Sit to Supine     Supine to sit: Supervision Sit to supine: Min assist   General bed mobility comments: Pt required use of BUEs to assist LLE out of bed during sup to sit and  required min A to get the LLE into bed during sit to sup  Transfers Overall transfer level: Needs assistance Equipment used: Rolling walker (2 wheeled) Transfers: Sit to/from Stand Sit to Stand: Min guard;From elevated surface         General transfer comment: Mod verbal cues for sequencing  Ambulation/Gait Ambulation/Gait assistance: Min guard Gait Distance (Feet): 5 Feet x 2 Assistive device: Rolling walker (2 wheeled) Gait Pattern/deviations: Step-to pattern Gait velocity: decreased   General Gait Details: Mod-max visual and verbal cues for proper sequencing during amb to maintain PWB status to the LLE.  Pt training provided on proper sequencing during ambulation forwards, backwards, and sideways  Stairs            Wheelchair Mobility    Modified Rankin (Stroke Patients Only)       Balance Overall balance assessment: Needs assistance   Sitting balance-Leahy Scale: Normal     Standing balance support: Bilateral upper extremity supported Standing balance-Leahy Scale: Good                               Pertinent Vitals/Pain Pain Assessment: 0-10 Pain Score: 8  Pain Location: L hip Pain Descriptors / Indicators: Aching;Dull Pain Intervention(s): Premedicated before session;Monitored during session;Ice applied    Home Living Family/patient expects to be discharged to:: Private residence Living Arrangements: Alone Available Help at Discharge: Family;Available 24 hours/day  Type of Home: House Home Access: Stairs to enter Entrance Stairs-Rails: None Entrance Stairs-Number of Steps: 2 Home Layout: One level Home Equipment: Walker - standard;Cane - single point      Prior Function Level of Independence: Independent with assistive device(s)         Comments: Mod Ind amb with a SPC, no fall history, Ind with ADLs, 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        Extremity/Trunk Assessment    Upper Extremity Assessment Upper Extremity Assessment: Overall WFL for tasks assessed    Lower Extremity Assessment Lower Extremity Assessment: Generalized weakness;LLE deficits/detail LLE Deficits / Details: L hip flex strength <3/5 LLE Sensation: WNL LLE Coordination: WNL       Communication   Communication: No difficulties  Cognition Arousal/Alertness: Awake/alert Behavior During Therapy: WFL for tasks assessed/performed Overall Cognitive Status: Within Functional Limits for tasks assessed                                        General Comments      Exercises Total Joint Exercises Ankle Circles/Pumps: AROM;Both;10 reps Long Arc Quad: AROM;Strengthening;Both;10 reps;15 reps Knee Flexion: AROM;Strengthening;Both;10 reps;15 reps Other Exercises Other Exercises: Sit to/from stand transfer training to/from various height surfaces  Multiple sup to/from sit training from both sides of the bed with verbal cues for sequencing    Assessment/Plan    PT Assessment Patient needs continued PT services  PT Problem List Decreased strength;Decreased activity tolerance;Decreased balance;Decreased knowledge of use of DME;Decreased mobility       PT Treatment Interventions DME instruction;Gait training;Stair training;Functional mobility training;Therapeutic activities;Therapeutic exercise;Balance training;Patient/family education    PT Goals (Current goals can be found in the Care Plan section)  Acute Rehab PT Goals Patient Stated Goal: Pain free mobility PT Goal Formulation: With patient Time For Goal Achievement: 10/05/18 Potential to Achieve Goals: Good    Frequency BID   Barriers to discharge        Co-evaluation               AM-PAC PT "6 Clicks" Mobility  Outcome Measure Help needed turning from your back to your side while in a flat bed without using bedrails?: A Little Help needed moving from lying on your back to sitting on the side of a flat  bed without using bedrails?: A Little Help needed moving to and from a bed to a chair (including a wheelchair)?: A Little Help needed standing up from a chair using your arms (e.g., wheelchair or bedside chair)?: A Little Help needed to walk in hospital room?: A Little Help needed climbing 3-5 steps with a railing? : A Little 6 Click Score: 18    End of Session Equipment Utilized During Treatment: Gait belt Activity Tolerance: Patient tolerated treatment well Patient left: in chair;with call bell/phone within reach;with chair alarm set;with family/visitor present;with SCD's reapplied;Other (comment)(Ice bag to L hip) Nurse Communication: Mobility status PT Visit Diagnosis: Other abnormalities of gait and mobility (R26.89);Muscle weakness (generalized) (M62.81)    Time: 5809-9833 PT Time Calculation (min) (ACUTE ONLY): 63 min   Charges:   PT Evaluation $PT Eval Moderate Complexity: 1 Mod PT Treatments $Gait Training: 8-22 mins $Therapeutic Activity: 8-22 mins        D. Royetta Asal PT, DPT 09/22/18, 4:59 PM

## 2018-09-22 NOTE — H&P (Signed)
Reviewed paper H+P, will be scanned into chart. No changes noted.  

## 2018-09-22 NOTE — TOC Benefit Eligibility Note (Signed)
Transition of Care [facility]) Benefit Eligibility Note    Patient Details  Name: Colton Choi MRN: [id number] Date of Birth: 1965-04-01   Medication/Dose: LOVENOX  55 MG SYRINGES  DAILY X 14 DAYS  Covered?: Yes     Prescription Coverage Preferred Pharmacy: [facility]  Spoke with Person/Company/Phone Number:: VENISE  @ [employer] # [phone]  Co-Pay: $200.00  Prior Approval: Yes(# [phone])  Deductible: Unmet  Additional Notes: ENOXAPARIN 40 MG SYRINGES DAILY X 14 DAYS , COVER- YES, CO-PAY- $ 209.83, PRIOR APPROVAL- YES # [phone]    Memory Argue Phone Number: 09/22/2018, 5:00 PM

## 2018-09-22 NOTE — Op Note (Signed)
09/22/2018  11:27 AM  PATIENT:  Colton Choi  53 y.o. male  PRE-OPERATIVE DIAGNOSIS:  PRIMARY OSTEOARTHRITIS  Left HIP  POST-OPERATIVE DIAGNOSIS:  PRIMARY OSTEOARTHRITIS LEFT HIP  PROCEDURE:  Procedure(s): TOTAL HIP ARTHROPLASTY ANTERIOR APPROACH (Left)  SURGEON: Laurene Footman, MD  ASSISTANTS: none  ANESTHESIA:   general  EBL:  Total I/O In: 600 [I.V.:600] Out: 800 [Urine:300; Blood:500]  BLOOD ADMINISTERED:none  DRAINS: none   LOCAL MEDICATIONS USED:  MARCAINE    and OTHER Exparel  SPECIMEN:  Source of Specimen:  Left femoral head  DISPOSITION OF SPECIMEN:  PATHOLOGY  COUNTS:  YES  TOURNIQUET:  * No tourniquets in log *  IMPLANTS: Medacta AMIS 3 lateralized stem, 58 mm mPACT DM cup and liner with S 28 mm ceramic head.  DICTATION: Viviann Spare Dictation   The patient was brought to the operating room and after spinal anesthesia was obtained patient was placed on the operative table with the ipsilateral foot into the Medacta attachment, contralateral leg on a well-padded table. C-arm was brought in and preop template x-ray taken. After prepping and draping in usual sterile fashion appropriate patient identification and timeout procedures were completed. Anterior approach to the hip was obtained and centered over the greater trochanter and TFL muscle. The subcutaneous tissue was incised hemostasis being achieved by electrocautery. TFL fascia was incised and the muscle retracted laterally deep retractor placed. The lateral femoral circumflex vessels were identified and ligated. The anterior capsule was exposed and a capsulotomy performed. The neck was identified and a femoral neck cut carried out with a saw. The head was removed without difficulty and showed sclerotic femoral head and acetabulum. Reaming was carried out to 58 mm and a 58 mm cup trial gave appropriate tightness to the acetabular component a 58 DM cup was impacted into position. The leg was then externally rotated and  ischiofemoral and pubofemoral releases carried out. The femur was sequentially broached to a size 3, size 3 lateralized stem with S head trials were placed and the final components chosen. The 3 lateralized stem was inserted along with a ceramic S 28 mm head and 58 mm liner. The hip was reduced and was stable the wound was thoroughly irrigated with fibrillar placed along the posterior capsule and medial neck. The deep fascia ws closed using a heavy Quill after infiltration of 30 cc of quarter percent Sensorcaine with epinephrine. Exparel was injected throughout the case to aid in postop analgesia.  3-0 V-loc to close the skin with skin staples.  Incisional wound VAC applied and patient was sent to recovery in stable condition.   PLAN OF CARE: Admit to inpatient

## 2018-09-23 LAB — BASIC METABOLIC PANEL
Anion gap: 8 (ref 5–15)
BUN: 10 mg/dL (ref 6–20)
CO2: 24 mmol/L (ref 22–32)
Calcium: 8.3 mg/dL — ABNORMAL LOW (ref 8.9–10.3)
Chloride: 101 mmol/L (ref 98–111)
Creatinine, Ser: 0.82 mg/dL (ref 0.61–1.24)
GFR calc Af Amer: >60 mL/min (ref >60)
GFR calc non Af Amer: >60 mL/min (ref >60)
Glucose, Bld: 260 mg/dL — ABNORMAL HIGH (ref 70–99)
Potassium: 4.5 mmol/L (ref 3.5–5.1)
Sodium: 133 mmol/L — ABNORMAL LOW (ref 135–145)

## 2018-09-23 LAB — CBC
HCT: 36.7 % — ABNORMAL LOW (ref 39.0–52.0)
Hemoglobin: 12.5 g/dL — ABNORMAL LOW (ref 13.0–17.0)
MCH: 30.5 pg (ref 26.0–34.0)
MCHC: 34.1 g/dL (ref 30.0–36.0)
MCV: 89.5 fL (ref 80.0–100.0)
Platelets: 221 10*3/uL (ref 150–400)
RBC: 4.1 MIL/uL — ABNORMAL LOW (ref 4.22–5.81)
RDW: 12.4 % (ref 11.5–15.5)
WBC: 9.7 10*3/uL (ref 4.0–10.5)
nRBC: 0 % (ref 0.0–0.2)

## 2018-09-23 LAB — SURGICAL PATHOLOGY

## 2018-09-23 LAB — GLUCOSE, CAPILLARY
Glucose-Capillary: 217 mg/dL — ABNORMAL HIGH (ref 70–99)
Glucose-Capillary: 205 mg/dL — ABNORMAL HIGH (ref 70–99)
Glucose-Capillary: 157 mg/dL — ABNORMAL HIGH (ref 70–99)
Glucose-Capillary: 182 mg/dL — ABNORMAL HIGH (ref 70–99)

## 2018-09-23 NOTE — Progress Notes (Signed)
Physical Therapy Treatment Patient Details Name: Colton Choi MRN: [id number] DOB: 11/27/1965 Today's Date: 09/23/2018    History of Present Illness Pt is a 53 yo male diagnosed with OA of the left hip and is s/p elective L THA.  PMH includes: HTN, A-fib, R ankle fx, lymphoma, CKD, DM, and HLD.    PT Comments    Pt continues to make good progress towards goals and is highly motivated to participate during the session.  Pt was CGA with sit to/from stand transfers with good eccentric and concentric control and only min verbal cues for proper sequencing.  Pt was able to amb 20' with a RW and CGA with good stability and with good carryover regarding proper sequencing for LLE PWB compliance.  Pt will benefit from HHPT services upon discharge to safely address above deficits for decreased caregiver assistance and eventual return to PLOF.        Follow Up Recommendations  Home health PT     Equipment Recommendations  Other (comment)(Tall Bariatric FWW)    Recommendations for Other Services       Precautions / Restrictions Precautions Precautions: Anterior Hip;Fall Restrictions Weight Bearing Restrictions: Yes LLE Weight Bearing: Partial weight bearing LLE Partial Weight Bearing Percentage or Pounds: 50%; OK per Dr. Rudene Christians for pt to be WBAT for stair training only but attempt to take as much weight off of the LLE as safely possible.    Mobility  Bed Mobility Overal bed mobility: Needs Assistance Bed Mobility: Supine to Sit     Supine to sit: Modified independent (Device/Increase time);HOB elevated     General bed mobility comments: NT, pt in recliner  Transfers Overall transfer level: Needs assistance Equipment used: Rolling walker (2 wheeled) Transfers: Sit to/from Stand Sit to Stand: Min guard;From elevated surface         General transfer comment: Good eccentric and concentric control during sit to/from stands with min verbal cues for hand  placement  Ambulation/Gait Ambulation/Gait assistance: Min guard Gait Distance (Feet): 20 Feet Assistive device: Rolling walker (2 wheeled) Gait Pattern/deviations: Step-to pattern Gait velocity: decreased   General Gait Details: Good carryover with proper sequencing for LLE PWB compliance with only min verbal cues required this session.   Stairs             Wheelchair Mobility    Modified Rankin (Stroke Patients Only)       Balance Overall balance assessment: Needs assistance Sitting-balance support: Feet supported;No upper extremity supported Sitting balance-Leahy Scale: Normal Sitting balance - Comments: Able to use LH reacher to don underwear while seated EOB. Good weight shift and stable reaching in/out BOS.   Standing balance support: Bilateral upper extremity supported Standing balance-Leahy Scale: Good                              Cognition Arousal/Alertness: Awake/alert Behavior During Therapy: WFL for tasks assessed/performed Overall Cognitive Status: Within Functional Limits for tasks assessed                                        Exercises Total Joint Exercises Long Arc Quad: AROM;Strengthening;Both;10 reps;15 reps Knee Flexion: AROM;Strengthening;Both;10 reps;15 reps Marching in Standing: Left;5 reps;10 reps;Standing Other Exercises Other Exercises: Standing mini squats with small amplitude 2 x 5, standing LLE hip flex and SLR x 10 each, standing B heel raises  x 10 with BUE assist on the RW Other Exercises: HEP education and review for BLE APs, QS, GS, and LAQs x 10 every 1-2 hours as tolerated Other Exercises: Car transfer training sequencing education with verbal review and demonstration using chair to simulate car Other Exercises: Pt questions regarding DME reviewed and addressed    General Comments General comments (skin integrity, edema, etc.): Wound vac in place at start/end of session.      Pertinent  Vitals/Pain Pain Assessment: 0-10 Pain Score: 4  Pain Location: L hip Pain Descriptors / Indicators: Sore Pain Intervention(s): Premedicated before session;Monitored during session    Home Living Family/patient expects to be discharged to:: Private residence Living Arrangements: Alone Available Help at Discharge: Family;Available 24 hours/day(2 Sisters) Type of Home: House Home Access: Stairs to enter Entrance Stairs-Rails: None Home Layout: One level Home Equipment: Walker - standard;Cane - single point      Prior Function Level of Independence: Independent with assistive device(s)      Comments: Mod Ind amb with a SPC, no fall history, Ind with ADLs, 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 Progress towards PT goals: Progressing toward goals    Frequency    BID      PT Plan Current plan remains appropriate    Co-evaluation              AM-PAC PT "6 Clicks" Mobility   Outcome Measure  Help needed turning from your back to your side while in a flat bed without using bedrails?: A Little Help needed moving from lying on your back to sitting on the side of a flat bed without using bedrails?: A Little Help needed moving to and from a bed to a chair (including a wheelchair)?: A Little Help needed standing up from a chair using your arms (e.g., wheelchair or bedside chair)?: A Little Help needed to walk in hospital room?: A Little Help needed climbing 3-5 steps with a railing? : A Little 6 Click Score: 18    End of Session Equipment Utilized During Treatment: Gait belt Activity Tolerance: Patient tolerated treatment well Patient left: in chair;with call bell/phone within reach;with chair alarm set;with nursing/sitter in room Nurse Communication: Mobility status;Other (comment)(Nursing in  room addressing pt needs at end of session and stated would don SCDs) PT Visit Diagnosis: Other abnormalities of gait and mobility (R26.89);Muscle weakness (generalized) (M62.81)     Time: 8416-6063 PT Time Calculation (min) (ACUTE ONLY): 40 min  Charges:  $Gait Training: 8-22 mins $Therapeutic Exercise: 8-22 mins $Therapeutic Activity: 8-22 mins                     D. Scott Juli Odom PT, DPT 09/23/18, 12:10 PM

## 2018-09-23 NOTE — TOC Initial Note (Signed)
Transition of Care [facility]) - Initial/Assessment Note    Patient Details  Name: Colton Choi MRN: [id number] Date of Birth: Jan 13, 1966  Transition of Care [facility]) CM/SW Contact:    Su Hilt, RN Phone Number: 09/23/2018, 12:25 PM  Clinical Narrative:                 Met with the patient to discuss DC plan and needs He lives at home alone but has his sister going to help him He has a single point cane and a walker but would benefit from a RW and 3 in 1, needs tall and bariatric I notified [facility] with [employer].  The patient is agreeable to have those delivered to his home [facility] and I notified Helene Kelp He is on Xarelto for Blood thinners He sees Dr Ouida Sills as PCP and is up to date with visits He uses [facility] for meds and can afford his medication   Expected Discharge Plan: [place] Barriers to Discharge: Continued Medical Work up   Patient Goals and CMS Choice Patient states their goals for this hospitalization and ongoing recovery are:: go home CMS Medicare.gov Compare Post Acute Care list provided to:: Patient Choice offered to / list presented to : Patient  Expected Discharge Plan and Services Expected Discharge Plan: [place]   Discharge Planning Services: CM Consult Post Acute Care Choice: [place] arrangements for the past 2 months: Single Family Home                 DME Arranged: 3-N-1, Walker tall DME Agency: [employer] Date DME Agency Contacted: 09/23/18 Time DME Agency Contacted: 77 Representative spoke with at DME Agency: [place]: PT [place]: [facility] ([facility] [employer]) Date [place]: 09/23/18 Time [place]: 1224 Representative spoke with at [place]: [place] Arrangements/Services Living arrangements for the past 2 months: [place] Lives with:: Self Patient language and need for interpreter reviewed:: No Do  you feel safe going back to the place where you live?: Yes      Need for Family Participation in Patient Care: No (Comment) Care giver support system in place?: No (comment) Current home services: DME(single point cane and regular walker) Criminal Activity/Legal Involvement Pertinent to Current Situation/Hospitalization: No - Comment as needed  Activities of Daily Living Home Assistive Devices/Equipment: Eyeglasses, CBG Meter ADL Screening (condition at time of admission) Patient's cognitive ability adequate to safely complete daily activities?: Yes Is the patient deaf or have difficulty hearing?: No Does the patient have difficulty seeing, even when wearing glasses/contacts?: No Does the patient have difficulty concentrating, remembering, or making decisions?: No Patient able to express need for assistance with ADLs?: Yes Does the patient have difficulty dressing or bathing?: No Independently performs ADLs?: Yes (appropriate for developmental age) Does the patient have difficulty walking or climbing stairs?: Yes Weakness of Legs: Left Weakness of Arms/Hands: None  Permission Sought/Granted   Permission granted to share information with : Yes, Verbal Permission Granted              Emotional Assessment Appearance:: Appears stated age Attitude/Demeanor/Rapport: Engaged Affect (typically observed): Appropriate, Calm Orientation: : Oriented to Self, Oriented to Place, Oriented to  Time, Oriented to Situation Alcohol / Substance Use: Not Applicable Psych Involvement: No (comment)  Admission diagnosis:  PRIMARY OSTEOARTHRITIS OR RIGHT HIP Patient Active Problem List   Diagnosis Date Noted  .  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 Swelling of left lower extremity 10/10/2014  . Hip pain 10/10/2014  . Hodgkin lymphoma, nodular lymphocyte predominance ([place]) 05/04/2014  . Lymphadenopathy, inguinal 03/14/2014  . Combined fat and carbohydrate induced hyperlipemia  11/20/2013  . Diabetes ([place]) 11/20/2013  . Benign hypertension 11/20/2013  . Body mass index of 60 or higher 11/20/2013  . OP (osteoporosis) 11/20/2013   PCP:  Kirk Ruths, MD Pharmacy:   [facility] [address] [place]), [place] - [address] [place] ([place]) [place] [address] Phone: [phone] Fax: [phone]  [place], [place] - [address] [place] [address] Phone: [phone] Fax: [phone]     Social Determinants of Health (SDOH) Interventions    Readmission Risk Interventions No flowsheet data found.

## 2018-09-23 NOTE — Evaluation (Signed)
Occupational Therapy Evaluation Patient Details Name: Colton Choi MRN: [id number] DOB: [birth year] Today's Date: 09/23/2018    History of Present Illness Pt is a 53 yo male diagnosed with OA of the left hip and is s/p elective L THA.  PMH includes: HTN, A-fib, R ankle fx, lymphoma, CKD, DM, and HLD.   Clinical Impression   Colton Choi was seen for OT evaluation this date, POD#1 from above surgery. Pt was independent with AE in all ADLs prior to surgery, and using SPC for mobility due to L hip pain. Pt is eager to return to PLOF with less pain and improved safety and independence. At this session he states his goal is to get back to working in his yard. Pt currently requires SBA for functional mobility and STS transfers for mgt of lines and leads. Pt demonstrated overall good safety awareness and proficient use of AE for LB dressing on this date. He was able to use a long handled reacher to don underwear with supervision to SBA from this author. Pt instructed in self care skills, falls prevention strategies, home/routines modifications, DME/AE for LB bathing and dressing tasks, and compression stocking mgt strategies. All further OT needs can be addressed at the next venue of care. Will sign off. Please re-consult if additional OT needs arise during this admission. Upon hospital DC, recommend HHOT services to ensure pt safety and functional independence during meaningful occupations of daily life within his natural context.      Follow Up Recommendations  Home health OT    Equipment Recommendations  Other (comment)([facility])    Recommendations for Other Services       Precautions / Restrictions Precautions Precautions: Anterior Hip;Fall Restrictions Weight Bearing Restrictions: Yes LLE Weight Bearing: Partial weight bearing LLE Partial Weight Bearing Percentage or Pounds: 50%      Mobility Bed Mobility Overal bed mobility: Needs Assistance Bed Mobility: Supine to Sit     Supine to  sit: Modified independent (Device/Increase time);HOB elevated     General bed mobility comments: Pt required heavy use of bed reails and increased time/effort for sup>sit on this date. Good overall safety awareness and technique.  Transfers Overall transfer level: Needs assistance Equipment used: Rolling walker (2 wheeled) Transfers: Sit to/from Stand Sit to Stand: Min guard;From elevated surface         General transfer comment: Pt able to complete STS from elevated bed on this date. Good technique and awareness of PWB status. Able to take small steps to turn toward chair with use of RW to offload weight through arms. Good safety awareness during transfer to chair. Min VC's for technqiues.    Balance Overall balance assessment: Needs assistance Sitting-balance support: Feet supported;No upper extremity supported Sitting balance-Leahy Scale: Normal Sitting balance - Comments: Able to use LH reacher to don underwear while seated EOB. Good weight shift and stable reaching in/out BOS.   Standing balance support: Bilateral upper extremity supported Standing balance-Leahy Scale: Good                             ADL either performed or assessed with clinical judgement   ADL                                         General ADL Comments: Pt endorses ~2 year history of using sock-aid and LH  reacher for LBD. Was able to don underwear usin LHR and SBA for mgt of lines/leads on this date. Good technique and safety awareness t/o session. Supervision/SBA for safety during functional mobility. Min-Mod A to don compression stockings.     Vision Baseline Vision/History: Wears glasses Wears Glasses: At all times Patient Visual Report: No change from baseline       Perception     Praxis      Pertinent Vitals/Pain Pain Assessment: 0-10 Pain Score: 3  Pain Location: L hip Pain Descriptors / Indicators: Guarding;Sore Pain Intervention(s): Limited activity within  patient's tolerance;Monitored during session;Premedicated before session;Repositioned     Hand Dominance Left   Extremity/Trunk Assessment Upper Extremity Assessment Upper Extremity Assessment: Overall WFL for tasks assessed   Lower Extremity Assessment Lower Extremity Assessment: LLE deficits/detail;Defer to PT evaluation LLE Deficits / Details: LLE PWB (50). S/p Anterior THA.       Communication Communication Communication: No difficulties   Cognition Arousal/Alertness: Awake/alert Behavior During Therapy: WFL for tasks assessed/performed Overall Cognitive Status: Within Functional Limits for tasks assessed                                     General Comments  Wound vac in place at start/end of session.    Exercises Other Exercises Other Exercises: Pt instructed in falls prevention strategies, safe use of AE for functional mobility and LB ADL tasks, routines modifications, and PWB status on this date. Handout provided. Pt demonstrated good overall recall of information provided and is a regular user of AE at baseline. Return demonstrated use of LHR to don underwear with SBA for mgt of wound vack on this date.   Shoulder Instructions      Home Living Family/patient expects to be discharged to:: Private residence Living Arrangements: Alone Available Help at Discharge: Family;Available 24 hours/day(2 Sisters) Type of Home: House Home Access: Stairs to enter [employer] of Steps: 2 Entrance Stairs-Rails: None Home Layout: One level     Bathroom Shower/Tub: Teacher, early years/pre: Handicapped height     Home Equipment: Walker - standard;Cane - single point          Prior Functioning/Environment Level of Independence: Independent with assistive device(s)        Comments: Mod Ind amb with a SPC, no fall history, Ind with ADLs, 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 OT Goal Formulation: All assessment and education complete, DC therapy Time For Goal Achievement: 09/23/18 Potential to Achieve Goals: Good  OT Frequency:     Barriers to D/C:            Co-evaluation              AM-PAC OT "6 Clicks" Daily Activity     Outcome Measure Help from another person eating meals?: None Help from another person taking care of personal grooming?: None Help from another person toileting, which includes using toliet, bedpan, or urinal?: A Little Help from another  person bathing (including washing, rinsing, drying)?: A Little Help from another person to put on and taking off regular upper body clothing?: None Help from another person to put on and taking off regular lower body clothing?: A Little 6 Click Score: 21   End of Session Equipment Utilized During Treatment: Gait belt;Rolling walker  Activity Tolerance: Patient tolerated treatment well;No increased pain Patient left: in chair;with call bell/phone within reach;with chair alarm set  OT Visit Diagnosis: History of falling (Z91.81);Other abnormalities of gait and mobility (R26.89);Pain Pain - Right/Left: Left Pain - part of body: Hip                Time: 8527-7824 OT Time Calculation (min): 33 min Charges:  OT General Charges $OT Visit: 1 Visit OT Evaluation $OT Eval Low Complexity: 1 Low OT Treatments $Self Care/Home Management : 23-37 mins  Shara Blazing, M.S., OTR/L Ascom: [phone] 09/23/18, 9:39 AM

## 2018-09-23 NOTE — Progress Notes (Addendum)
   Subjective: 1 Day Post-Op Procedure(s) (LRB): TOTAL HIP ARTHROPLASTY ANTERIOR APPROACH (Left) Patient reports pain as mild.   Patient is well, and has had no acute complaints or problems Denies any CP, SOB, ABD pain. We will continue therapy today.  Plan is to go Home after hospital stay.  Objective: Vital signs in last 24 hours: Temp:  [97.6 F (36.4 C)-98.4 F (36.9 C)] 97.9 F (36.6 C) (08/12 0728) Pulse Rate:  [62-75] 68 (08/12 0728) Resp:  [10-20] 17 (08/12 0728) BP: (131-167)/(54-92) 131/78 (08/12 0728) SpO2:  [95 %-99 %] 97 % (08/12 0728)  Intake/Output from previous day: 08/11 0701 - 08/12 0700 In: 2035.9 [P.O.:540; I.V.:1495.9] Out: 3950 [Urine:3450; Blood:500] Intake/Output this shift: No intake/output data recorded.  Recent Labs    09/23/18 0550  HGB 12.5*   Recent Labs    09/23/18 0550  WBC 9.7  RBC 4.10*  HCT 36.7*  PLT 221   Recent Labs    09/23/18 0550  NA 133*  K 4.5  CL 101  CO2 24  BUN 10  CREATININE 0.82  GLUCOSE 260*  CALCIUM 8.3*   No results for input(s): LABPT, INR in the last 72 hours.  EXAM General - Patient is Alert, Appropriate and Oriented Extremity - Neurovascular intact Sensation intact distally Intact pulses distally Dorsiflexion/Plantar flexion intact No cellulitis present Compartment soft Dressing - dressing C/D/I and no drainage, prevena intact with 250 drainage Motor Function - intact, moving foot and toes well on exam.   Past Medical History:  Diagnosis Date  . A-fib ([place])   . Arthritis   . Broken ankle 20 years ago   Right  . Cancer [facility]) 2017   lymphoma  . Chronic kidney disease   . Diabetes mellitus without complication ([place])   . Dysrhythmia   . Gout   . Hyperlipidemia   . Hypertension     Assessment/Plan:   1 Day Post-Op Procedure(s) (LRB): TOTAL HIP ARTHROPLASTY ANTERIOR APPROACH (Left) Active Problems:   Status post total hip replacement, left  Estimated body mass index is 49.53 kg/m  as calculated from the following:   Height as of this encounter: 6\' 2"  (1.88 m).   Weight as of this encounter: 175 kg. Advance diet Up with therapy , 50% weight-Bearing to left leg Needs BM Labs stable Pain well controlled Recheck labs in the am CM to assist with discharge  DVT Prophylaxis - TED hose and SCDs. Xarelto 50% weight-Bearing to left leg   T. Rachelle Hora, PA-C [place] 09/23/2018, 8:11 AM

## 2018-09-24 LAB — BASIC METABOLIC PANEL
Anion gap: 5 (ref 5–15)
BUN: 13 mg/dL (ref 6–20)
CO2: 29 mmol/L (ref 22–32)
Calcium: 8.1 mg/dL — ABNORMAL LOW (ref 8.9–10.3)
Chloride: 101 mmol/L (ref 98–111)
Creatinine, Ser: 0.7 mg/dL (ref 0.61–1.24)
GFR calc Af Amer: >60 mL/min (ref >60)
GFR calc non Af Amer: >60 mL/min (ref >60)
Glucose, Bld: 128 mg/dL — ABNORMAL HIGH (ref 70–99)
Potassium: 4.2 mmol/L (ref 3.5–5.1)
Sodium: 135 mmol/L (ref 135–145)

## 2018-09-24 LAB — GLUCOSE, CAPILLARY
Glucose-Capillary: 151 mg/dL — ABNORMAL HIGH (ref 70–99)
Glucose-Capillary: 168 mg/dL — ABNORMAL HIGH (ref 70–99)
Glucose-Capillary: 173 mg/dL — ABNORMAL HIGH (ref 70–99)

## 2018-09-24 LAB — CBC
HCT: 34.6 % — ABNORMAL LOW (ref 39.0–52.0)
Hemoglobin: 11.6 g/dL — ABNORMAL LOW (ref 13.0–17.0)
MCH: 30 pg (ref 26.0–34.0)
MCHC: 33.5 g/dL (ref 30.0–36.0)
MCV: 89.4 fL (ref 80.0–100.0)
Platelets: 196 10*3/uL (ref 150–400)
RBC: 3.87 MIL/uL — ABNORMAL LOW (ref 4.22–5.81)
RDW: 12.7 % (ref 11.5–15.5)
WBC: 8 10*3/uL (ref 4.0–10.5)
nRBC: 0 % (ref 0.0–0.2)

## 2018-09-24 NOTE — Progress Notes (Signed)
Physical Therapy Treatment Patient Details Name: Colton Choi MRN: [id number] DOB: 1965-03-30 Today's Date: 09/24/2018    History of Present Illness Pt is a 53 yo male diagnosed with OA of the left hip and is s/p elective L THA.  PMH includes: HTN, A-fib, R ankle fx, lymphoma, CKD, DM, and HLD.    PT Comments    Pt in bed, ready for session.  Stated he did ex's this am in bed.  To edge of bed with increased time.  Once sitting, he is able to stand with supervision to walker.  Standing ex completed pre gait.  He is able to walk out of room to door with good ability to maintain PWB status.  He was given a ride with recliner to rehab gym for stair training.  He was able to go up/down 4 steps with crutches and min guard +2 for safety.  Overall doing well and generally steady with mobility.  Distances limited by writer and BUE fatigue to maintain WB status.  Voiced understanding.    Follow Up Recommendations  Home health PT     Equipment Recommendations  Rolling walker with 5" wheels;Crutches    Recommendations for Other Services       Precautions / Restrictions Precautions Precautions: Anterior Hip;Fall Restrictions Weight Bearing Restrictions: Yes LLE Weight Bearing: Partial weight bearing LLE Partial Weight Bearing Percentage or Pounds: 50%    Mobility  Bed Mobility Overal bed mobility: Needs Assistance Bed Mobility: Supine to Sit     Supine to sit: Modified independent (Device/Increase time);HOB elevated        Transfers Overall transfer level: Needs assistance Equipment used: Rolling walker (2 wheeled) Transfers: Sit to/from Stand Sit to Stand: From elevated surface;Supervision            Ambulation/Gait Ambulation/Gait assistance: Min guard Gait Distance (Feet): 20 Feet Assistive device: Rolling walker (2 wheeled) Gait Pattern/deviations: Step-to pattern Gait velocity: decreased   General Gait Details: Good carryover with proper sequencing for LLE PWB  compliance with min verbal cues required this session to prevent RLE from passing LLE.   Stairs Stairs: Yes Stairs assistance: Min guard;+2 safety/equipment Stair Management: With crutches Number of Stairs: 4 General stair comments: generally steady with min verbal cues for sequencing.   Wheelchair Mobility    Modified Rankin (Stroke Patients Only)       Balance Overall balance assessment: Needs assistance Sitting-balance support: Feet supported;No upper extremity supported Sitting balance-Leahy Scale: Normal     Standing balance support: Bilateral upper extremity supported Standing balance-Leahy Scale: Good                              Cognition Arousal/Alertness: Awake/alert Behavior During Therapy: WFL for tasks assessed/performed Overall Cognitive Status: Within Functional Limits for tasks assessed                                        Exercises Other Exercises Other Exercises: HEP review - stated he did them on his own in bed this am.  When standing marches and SLR LLE x 10.    General Comments        Pertinent Vitals/Pain Pain Assessment: 0-10 Pain Score: 5  Pain Location: L hip Pain Descriptors / Indicators: Sore Pain Intervention(s): Monitored during session;Premedicated before session;Limited activity within patient's tolerance    Home Living  Prior Function            PT Goals (current goals can now be found in the care plan section) Progress towards PT goals: Progressing toward goals    Frequency    BID      PT Plan Current plan remains appropriate    Co-evaluation              AM-PAC PT "6 Clicks" Mobility   Outcome Measure  Help needed turning from your back to your side while in a flat bed without using bedrails?: A Little Help needed moving from lying on your back to sitting on the side of a flat bed without using bedrails?: A Little Help needed moving to and from  a bed to a chair (including a wheelchair)?: A Little Help needed standing up from a chair using your arms (e.g., wheelchair or bedside chair)?: A Little Help needed to walk in hospital room?: A Little Help needed climbing 3-5 steps with a railing? : A Little 6 Click Score: 18    End of Session Equipment Utilized During Treatment: Gait belt Activity Tolerance: Patient tolerated treatment well Patient left: in chair;with call bell/phone within reach;with chair alarm set         Time: 7829-5621 PT Time Calculation (min) (ACUTE ONLY): 25 min  Charges:  $Gait Training: 8-22 mins $Therapeutic Exercise: 8-22 mins                     Chesley Noon, PTA 09/24/18, 10:39 AM

## 2018-09-24 NOTE — Progress Notes (Signed)
   Subjective: 2 Days Post-Op Procedure(s) (LRB): TOTAL HIP ARTHROPLASTY ANTERIOR APPROACH (Left) Patient reports pain as mild.   Patient is well, and has had no acute complaints or problems Denies any CP, SOB, ABD pain. We will continue therapy today.  Plan is to go Home after hospital stay.  Objective: Vital signs in last 24 hours: Temp:  [98 F (36.7 C)-98.1 F (36.7 C)] 98.1 F (36.7 C) (08/12 2312) Pulse Rate:  [62-72] 62 (08/13 0619) Resp:  [18-19] 19 (08/12 2312) BP: (104-140)/(53-73) 110/53 (08/13 0619) SpO2:  [97 %-99 %] 99 % (08/13 0619)  Intake/Output from previous day: 08/12 0701 - 08/13 0700 In: 240 [P.O.:240] Out: 2100 [Urine:2100] Intake/Output this shift: No intake/output data recorded.  Recent Labs    09/23/18 0550 09/24/18 0332  HGB 12.5* 11.6*   Recent Labs    09/23/18 0550 09/24/18 0332  WBC 9.7 8.0  RBC 4.10* 3.87*  HCT 36.7* 34.6*  PLT 221 196   Recent Labs    09/23/18 0550 09/24/18 0332  NA 133* 135  K 4.5 4.2  CL 101 101  CO2 24 29  BUN 10 13  CREATININE 0.82 0.70  GLUCOSE 260* 128*  CALCIUM 8.3* 8.1*   No results for input(s): LABPT, INR in the last 72 hours.  EXAM General - Patient is Alert, Appropriate and Oriented Extremity - Neurovascular intact Sensation intact distally Intact pulses distally Dorsiflexion/Plantar flexion intact No cellulitis present Compartment soft Dressing - dressing C/D/I and no drainage, prevena intact with 250 drainage, stable Motor Function - intact, moving foot and toes well on exam.   Past Medical History:  Diagnosis Date  . A-fib ([place])   . Arthritis   . Broken ankle 20 years ago   Right  . Cancer [facility]) 2017   lymphoma  . Chronic kidney disease   . Diabetes mellitus without complication ([place])   . Dysrhythmia   . Gout   . Hyperlipidemia   . Hypertension     Assessment/Plan:   2 Days Post-Op Procedure(s) (LRB): TOTAL HIP ARTHROPLASTY ANTERIOR APPROACH (Left) Active Problems:  Status post total hip replacement, left  Estimated body mass index is 49.53 kg/m as calculated from the following:   Height as of this encounter: 6\' 2"  (1.88 m).   Weight as of this encounter: 175 kg. Advance diet Up with therapy , 50% weight-Bearing to left leg Needs BM Labs stable VSS, BP soft, hold metoprolol Pain well controlled CM to assist with discharge  DVT Prophylaxis - TED hose and SCDs. Xarelto 50% weight-Bearing to left leg   T. Rachelle Hora, PA-C [place] 09/24/2018, 7:39 AM

## 2018-09-24 NOTE — Progress Notes (Signed)
Physical Therapy Treatment Patient Details Name: Colton Choi MRN: [id number] DOB: [birth year] Today's Date: 09/24/2018    History of Present Illness Pt is a 53 yo male diagnosed with OA of the left hip and is s/p elective L THA.  PMH includes: HTN, A-fib, R ankle fx, lymphoma, CKD, DM, and HLD.    PT Comments    Sister in room for mobility training.  Stood with supervision to walker and was able to walk 25' with PWB and good use of RW.  Minimal verbal cues for walker position but overall does well.  To stairs for training.  Sister brought in bariatric crutches and they were fitted for comfort.  Up/down 4 steps with min guard and education for sister and pt.  Both report comfort with mobility.  Discussed home safety.   Follow Up Recommendations  Home health PT     Equipment Recommendations  Rolling walker with 5" wheels;Crutches    Recommendations for Other Services       Precautions / Restrictions Precautions Precautions: Anterior Hip;Fall Restrictions Weight Bearing Restrictions: Yes LLE Weight Bearing: Partial weight bearing LLE Partial Weight Bearing Percentage or Pounds: 50%    Mobility  Bed Mobility Overal bed mobility: Needs Assistance Bed Mobility: Supine to Sit     Supine to sit: Modified independent (Device/Increase time);HOB elevated     General bed mobility comments: NT, pt in recliner  Transfers Overall transfer level: Needs assistance Equipment used: Rolling walker (2 wheeled) Transfers: Sit to/from Stand Sit to Stand: Supervision            Ambulation/Gait Ambulation/Gait assistance: Min guard;Supervision Gait Distance (Feet): 25 Feet Assistive device: Rolling walker (2 wheeled) Gait Pattern/deviations: Step-to pattern Gait velocity: decreased   General Gait Details: Good carryover with proper sequencing for LLE PWB compliance with min verbal cues required this session to prevent RLE from passing LLE.   Stairs Stairs: Yes Stairs  assistance: Min guard Stair Management: With crutches;Step to pattern Number of Stairs: 4 General stair comments: generally steady with min verbal cues for sequencing.   Wheelchair Mobility    Modified Rankin (Stroke Patients Only)       Balance Overall balance assessment: Needs assistance Sitting-balance support: Feet supported;No upper extremity supported Sitting balance-Leahy Scale: Normal     Standing balance support: Bilateral upper extremity supported Standing balance-Leahy Scale: Good                              Cognition Arousal/Alertness: Awake/alert Behavior During Therapy: WFL for tasks assessed/performed Overall Cognitive Status: Within Functional Limits for tasks assessed                                        Exercises Other Exercises Other Exercises: sister in for mobility training.    General Comments        Pertinent Vitals/Pain Pain Assessment: 0-10 Pain Score: 3  Pain Location: L hip Pain Descriptors / Indicators: Sore Pain Intervention(s): Limited activity within patient's tolerance;Monitored during session    Home Living                      Prior Function            PT Goals (current goals can now be found in the care plan section) Progress towards PT goals: Progressing toward goals  Frequency    BID      PT Plan Current plan remains appropriate    Co-evaluation              AM-PAC PT "6 Clicks" Mobility   Outcome Measure  Help needed turning from your back to your side while in a flat bed without using bedrails?: A Little Help needed moving from lying on your back to sitting on the side of a flat bed without using bedrails?: A Little Help needed moving to and from a bed to a chair (including a wheelchair)?: A Little Help needed standing up from a chair using your arms (e.g., wheelchair or bedside chair)?: A Little Help needed to walk in hospital room?: A Little Help needed  climbing 3-5 steps with a railing? : A Little 6 Click Score: 18    End of Session Equipment Utilized During Treatment: Gait belt Activity Tolerance: Patient tolerated treatment well Patient left: in chair;with call bell/phone within reach;with chair alarm set;with family/visitor present         Time: 1337-1400 PT Time Calculation (min) (ACUTE ONLY): 23 min  Charges:  $Gait Training: 23-37 mins $Therapeutic Exercise: 8-22 mins                     Chesley Noon, PTA 09/24/18, 2:08 PM

## 2018-09-25 LAB — GLUCOSE, CAPILLARY: Glucose-Capillary: 174 mg/dL — ABNORMAL HIGH (ref 70–99)

## 2018-09-25 MED ORDER — METHOCARBAMOL 500 MG PO TABS: 500.0000 mg | ORAL_TABLET | Freq: Four times a day (QID) | ORAL | 0 refills | Status: DC | PRN

## 2018-09-25 MED ORDER — HYDROCODONE-ACETAMINOPHEN 7.5-325 MG PO TABS: 1.0000 | ORAL_TABLET | ORAL | 0 refills | Status: DC | PRN

## 2018-09-25 NOTE — Progress Notes (Signed)
   Subjective: 3 Days Post-Op Procedure(s) (LRB): TOTAL HIP ARTHROPLASTY ANTERIOR APPROACH (Left) Patient reports pain as mild.   Patient is well, and has had no acute complaints or problems Denies any CP, SOB, ABD pain. We will continue therapy today.  Plan is to go Home after hospital stay.  Objective: Vital signs in last 24 hours: Temp:  [97.7 F (36.5 C)-98 F (36.7 C)] 98 F (36.7 C) (08/13 2339) Pulse Rate:  [61-77] 77 (08/14 0654) Resp:  [17-20] 17 (08/13 2339) BP: (114-132)/(54-64) 130/54 (08/14 0654) SpO2:  [96 %-99 %] 96 % (08/14 0654)  Intake/Output from previous day: 08/13 0701 - 08/14 0700 In: 12 [P.O.:12] Out: 550 [Urine:550] Intake/Output this shift: No intake/output data recorded.  Recent Labs    09/23/18 0550 09/24/18 0332  HGB 12.5* 11.6*   Recent Labs    09/23/18 0550 09/24/18 0332  WBC 9.7 8.0  RBC 4.10* 3.87*  HCT 36.7* 34.6*  PLT 221 196   Recent Labs    09/23/18 0550 09/24/18 0332  NA 133* 135  K 4.5 4.2  CL 101 101  CO2 24 29  BUN 10 13  CREATININE 0.82 0.70  GLUCOSE 260* 128*  CALCIUM 8.3* 8.1*   No results for input(s): LABPT, INR in the last 72 hours.  EXAM General - Patient is Alert, Appropriate and Oriented Extremity - Neurovascular intact Sensation intact distally Intact pulses distally Dorsiflexion/Plantar flexion intact No cellulitis present Compartment soft Dressing - dressing C/D/I and no drainage, prevena intact with 250 drainage, stable Motor Function - intact, moving foot and toes well on exam.   Past Medical History:  Diagnosis Date  . A-fib ([place])   . Arthritis   . Broken ankle 20 years ago   Right  . Cancer [facility]) 2017   lymphoma  . Chronic kidney disease   . Diabetes mellitus without complication ([place])   . Dysrhythmia   . Gout   . Hyperlipidemia   . Hypertension     Assessment/Plan:   3 Days Post-Op Procedure(s) (LRB): TOTAL HIP ARTHROPLASTY ANTERIOR APPROACH (Left) Active Problems:  Status post total hip replacement, left  Estimated body mass index is 49.53 kg/m as calculated from the following:   Height as of this encounter: 6\' 2"  (1.88 m).   Weight as of this encounter: 175 kg. Advance diet Up with therapy , 50% weight-Bearing to left leg Needs BM Labs stable VSS Pain well controlled CM to assist with discharge to home with home health PT today.  DVT Prophylaxis - TED hose and SCDs. Xarelto 50% weight-Bearing to left leg   T. Rachelle Hora, PA-C [place] 09/25/2018, 7:44 AM

## 2018-09-25 NOTE — Discharge Instructions (Signed)
ANTERIOR APPROACH TOTAL HIP REPLACEMENT POSTOPERATIVE DIRECTIONS   Hip Rehabilitation, Guidelines Following Surgery  The results of a hip operation are greatly improved after range of motion and muscle strengthening exercises. Follow all safety measures which are given to protect your hip. If any of these exercises cause increased pain or swelling in your joint, decrease the amount until you are comfortable again. Then slowly increase the exercises. Call your caregiver if you have problems or questions.   HOME CARE INSTRUCTIONS  Remove items at home which could result in a fall. This includes throw rugs or furniture in walking pathways.   ICE to the affected hip every three hours for 30 minutes at a time and then as needed for pain and swelling.  Continue to use ice on the hip for pain and swelling from surgery. You may notice swelling that will progress down to the foot and ankle.  This is normal after surgery.  Elevate the leg when you are not up walking on it.    Continue to use the breathing machine which will help keep your temperature down.  It is common for your temperature to cycle up and down following surgery, especially at night when you are not up moving around and exerting yourself.  The breathing machine keeps your lungs expanded and your temperature down.  Do not place pillow under knee, focus on keeping the knee straight while resting  DIET You may resume your previous home diet once your are discharged from the hospital.  DRESSING / WOUND CARE / SHOWERING Please remove provena negative pressure dressing on 10/02/2018 and apply honey comb dressing. Keep dressing clean and dry at all times.   ACTIVITY Walk with your walker as instructed. Use walker as long as suggested by your caregivers. Avoid periods of inactivity such as sitting longer than an hour when not asleep. This helps prevent blood clots.  You may resume a sexual relationship in one month or when given the OK by  your doctor.  You may return to work once you are cleared by your doctor.  Do not drive a car for 6 weeks or until released by you surgeon.  Do not drive while taking narcotics.  WEIGHT BEARING Weight bearing as tolerated. Use walker/cane as needed for at least 4 weeks post op.  POSTOPERATIVE CONSTIPATION PROTOCOL Constipation - defined medically as fewer than three stools per week and severe constipation as less than one stool per week.  One of the most common issues patients have following surgery is constipation.  Even if you have a regular bowel pattern at home, your normal regimen is likely to be disrupted due to multiple reasons following surgery.  Combination of anesthesia, postoperative narcotics, change in appetite and fluid intake all can affect your bowels.  In order to avoid complications following surgery, here are some recommendations in order to help you during your recovery period.  Colace (docusate) - Pick up an over-the-counter form of Colace or another stool softener and take twice a day as long as you are requiring postoperative pain medications.  Take with a full glass of water daily.  If you experience loose stools or diarrhea, hold the colace until you stool forms back up.  If your symptoms do not get better within 1 week or if they get worse, check with your doctor.  Dulcolax (bisacodyl) - Pick up over-the-counter and take as directed by the product packaging as needed to assist with the movement of your bowels.  Take with a  full glass of water.  Use this product as needed if not relieved by Colace only.  ° °MiraLax (polyethylene glycol) - Pick up over-the-counter to have on hand.  MiraLax is a solution that will increase the amount of water in your bowels to assist with bowel movements.  Take as directed and can mix with a glass of water, juice, soda, coffee, or tea.  Take if you go more than two days without a movement. °Do not use MiraLax more than once per day. Call your  doctor if you are still constipated or irregular after using this medication for 7 days in a row. ° °If you continue to have problems with postoperative constipation, please contact the office for further assistance and recommendations.  If you experience "the worst abdominal pain ever" or develop nausea or vomiting, please contact the office immediatly for further recommendations for treatment. ° °ITCHING ° If you experience itching with your medications, try taking only a single pain pill, or even half a pain pill at a time.  You can also use Benadryl over the counter for itching or also to help with sleep.  ° °TED HOSE STOCKINGS °Wear the elastic stockings on both legs for six weeks following surgery during the day but you may remove then at night for sleeping. ° °MEDICATIONS °See your medication summary on the “After Visit Summary” that the nursing staff will review with you prior to discharge.  You may have some home medications which will be placed on hold until you complete the course of blood thinner medication.  It is important for you to complete the blood thinner medication as prescribed by your surgeon.  Continue your approved medications as instructed at time of discharge. ° °PRECAUTIONS °If you experience chest pain or shortness of breath - call 911 immediately for transfer to the hospital emergency department.  °If you develop a fever greater that 101 F, purulent drainage from wound, increased redness or drainage from wound, foul odor from the wound/dressing, or calf pain - CONTACT YOUR SURGEON.   °                                                °FOLLOW-UP APPOINTMENTS °Make sure you keep all of your appointments after your operation with your surgeon and caregivers. You should call the office at the above phone number and make an appointment for approximately two weeks after the date of your surgery or on the date instructed by your surgeon outlined in the "After Visit Summary". ° °RANGE OF MOTION  AND STRENGTHENING EXERCISES  °These exercises are designed to help you keep full movement of your hip joint. Follow your caregiver's or physical therapist's instructions. Perform all exercises about fifteen times, three times per day or as directed. Exercise both hips, even if you have had only one joint replacement. These exercises can be done on a training (exercise) mat, on the floor, on a table or on a bed. Use whatever works the best and is most comfortable for you. Use music or television while you are exercising so that the exercises are a pleasant break in your day. This will make your life better with the exercises acting as a break in routine you can look forward to.  °Lying on your back, slowly slide your foot toward your buttocks, raising your knee up off the floor. Then   slowly slide your foot back down until your leg is straight again.  °Lying on your back spread your legs as far apart as you can without causing discomfort.  °Lying on your side, raise your upper leg and foot straight up from the floor as far as is comfortable. Slowly lower the leg and repeat.  °Lying on your back, tighten up the muscle in the front of your thigh (quadriceps muscles). You can do this by keeping your leg straight and trying to raise your heel off the floor. This helps strengthen the largest muscle supporting your knee.  °Lying on your back, tighten up the muscles of your buttocks both with the legs straight and with the knee bent at a comfortable angle while keeping your heel on the floor.  ° °IF YOU ARE TRANSFERRED TO A SKILLED REHAB FACILITY °If the patient is transferred to a skilled rehab facility following release from the hospital, a list of the current medications will be sent to the facility for the patient to continue.  When discharged from the skilled rehab facility, please have the facility set up the patient's Home Health Physical Therapy prior to being released. Also, the skilled facility will be responsible  for providing the patient with their medications at time of release from the facility to include their pain medication, the muscle relaxants, and their blood thinner medication. If the patient is still at the rehab facility at time of the two week follow up appointment, the skilled rehab facility will also need to assist the patient in arranging follow up appointment in our office and any transportation needs. ° °MAKE SURE YOU:  °Understand these instructions.  °Get help right away if you are not doing well or get worse.  ° ° °Pick up stool softner and laxative for home use following surgery while on pain medications. °Continue to use ice for pain and swelling after surgery. °Do not use any lotions or creams on the incision until instructed by your surgeon. ° °

## 2018-09-25 NOTE — Discharge Summary (Signed)
Physician Discharge Summary  Patient ID: Colton Choi MRN: [id number] DOB/AGE: 52-Oct-1967 52 y.o.  Admit date: 09/22/2018 Discharge date: 09/25/2018  Admission Diagnoses:  PRIMARY OSTEOARTHRITIS OR RIGHT HIP   Discharge Diagnoses: Patient Active Problem List   Diagnosis Date Noted  . 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 Swelling of left lower extremity 10/10/2014  . Hip pain 10/10/2014  . 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 Lymphadenopathy, inguinal 03/14/2014  . Combined fat and carbohydrate induced hyperlipemia 11/20/2013  . Diabetes ([place]) 11/20/2013  . Benign hypertension 11/20/2013  . Body mass index of 60 or higher 11/20/2013  . OP (osteoporosis) 11/20/2013    Past Medical History:  Diagnosis Date  . A-fib ([place])   . Arthritis   . Broken ankle 20 years ago   Right  . Cancer [facility]) 2017   lymphoma  . Chronic kidney disease   . Diabetes mellitus without complication ([place])   . Dysrhythmia   . Gout   . Hyperlipidemia   . Hypertension      Transfusion: none   Consultants (if any):   Discharged Condition: Improved  Hospital Course: Colton Choi is an 53 y.o. male who was admitted 09/22/2018 with a diagnosis of left hip osteoarthritis and went to the operating room on 09/22/2018 and underwent the above named procedures.    Surgeries: Procedure(s): TOTAL HIP ARTHROPLASTY ANTERIOR APPROACH on 09/22/2018 Patient tolerated the surgery well. Taken to PACU where she was stabilized and then transferred to the orthopedic floor.  Started on Xarelto. Foot pumps applied bilaterally at 80 mm. Heels elevated on bed with rolled towels. No evidence of DVT. Negative [place]. Physical therapy started on day #1 for gait training and transfer. OT started day #1 for ADL and assisted devices.  Patient's foley was d/c on day #1. Patient's IV was d/c on day #2.  On post op day #3 patient was stable and ready for discharge to home with home  health PT.  Implants: Medacta AMIS 3 lateralized stem, 58 mm mPACT DM cup and liner with S 28 mm ceramic head  He was given perioperative antibiotics:  Anti-infectives (From admission, onward)   Start     Dose/Rate Route Frequency Ordered Stop   09/22/18 1245  ceFAZolin (ANCEF) IVPB 2g/100 mL premix     2 g 200 mL/hr over 30 Minutes Intravenous Every 6 hours 09/22/18 1236 09/23/18 0041   09/21/18 2215  ceFAZolin (ANCEF) 3 g in dextrose 5 % 50 mL IVPB     3 g 100 mL/hr over 30 Minutes Intravenous  Once 09/21/18 2205 09/22/18 0956    .  He was given sequential compression devices, early ambulation, and Xarelto, teds for DVT prophylaxis.  He benefited maximally from the hospital stay and there were no complications.    Recent vital signs:  Vitals:   09/24/18 2339 09/25/18 0654  BP: 132/63 (!) 130/54  Pulse: 71 77  Resp: 17   Temp: 98 F (36.7 C)   SpO2: 97% 96%    Recent laboratory studies:  Lab Results  Component Value Date   HGB 11.6 (L) 09/24/2018   HGB 12.5 (L) 09/23/2018   HGB 15.2 09/15/2018   Lab Results  Component Value Date   WBC 8.0 09/24/2018   PLT 196 09/24/2018   Lab Results  Component Value Date   INR 1.5 (H) 09/15/2018   Lab Results  Component Value Date   NA 135 09/24/2018   K 4.2 09/24/2018  CL 101 09/24/2018   CO2 29 09/24/2018   BUN 13 09/24/2018   CREATININE 0.70 09/24/2018   GLUCOSE 128 (H) 09/24/2018    Discharge Medications:   Allergies as of 09/25/2018      Reactions   Sulfa Antibiotics       Medication List    STOP taking these medications   ibuprofen 200 MG tablet Commonly known as: ADVIL     TAKE these medications   atorvastatin 40 MG tablet Commonly known as: LIPITOR Take 40 mg by mouth every morning.   glimepiride 2 MG tablet Commonly known as: AMARYL Take 2 mg by mouth daily with breakfast.   HYDROcodone-acetaminophen 7.5-325 MG tablet Commonly known as: NORCO Take 1-2 tablets by mouth every 4 (four) hours as  needed for severe pain (pain score 7-10).   lisinopril-hydrochlorothiazide 20-12.5 MG tablet Commonly known as: ZESTORETIC Take 1 tablet by mouth every morning.   metFORMIN 500 MG tablet Commonly known as: GLUCOPHAGE Take 1,000 mg by mouth daily with breakfast.   methocarbamol 500 MG tablet Commonly known as: ROBAXIN Take 1 tablet (500 mg total) by mouth every 6 (six) hours as needed for muscle spasms.   metoprolol 200 MG 24 hr tablet Commonly known as: TOPROL-XL Take 200 mg by mouth every morning.   rivaroxaban 20 MG Tabs tablet Commonly known as: XARELTO Take 20 mg by mouth daily with supper.   TURMERIC PO Take 1 capsule by mouth daily.            Durable Medical Equipment  (From admission, onward)         Start     Ordered   09/23/18 1209  DME 3 n 1  Once    Comments: Bariatric needed   09/23/18 1209   09/23/18 1209  DME Walker rolling  Once    Comments: Heavy-duty bariatric needed  Question:  Patient needs a walker to treat with the following condition  Answer:  Status post total hip replacement, left   09/23/18 1209   09/22/18 1237  DME Bedside commode  Once    Comments: Bariatric  Question:  Patient needs a bedside commode to treat with the following condition  Answer:  Status post total hip replacement, left   09/22/18 1236          Diagnostic Studies: Dg Hip Operative Unilat W Or W/o Pelvis Left  Result Date: 09/22/2018 CLINICAL DATA:  Left hip replacement EXAM: OPERATIVE left HIP (WITH PELVIS IF PERFORMED) AP VIEWS TECHNIQUE: Fluoroscopic spot image(s) were submitted for interpretation post-operatively. COMPARISON:  10/10/2014 FINDINGS: Two intraoperative fluoroscopic images from left total hip arthroplasty were submitted. The visualized portion of the left hip arthroplasty hardware appears in its expected alignment without obvious complication. 30 seconds of fluoroscopy time was utilized. Please see performing surgeon's operative note for further  detail. IMPRESSION: As above. Electronically Signed   By: Davina Poke M.D.   On: 09/22/2018 11:41   Dg Hip Unilat W Or W/o Pelvis 2-3 Views Left  Result Date: 09/22/2018 CLINICAL DATA:  Status post left hip replacement. EXAM: DG HIP (WITH OR WITHOUT PELVIS) 2-3V LEFT COMPARISON:  Intraoperative images acquired earlier the same day 09/22/2018, radiographs of the left hip 10/10/2014 FINDINGS: Significantly limited evaluation due to overlapping soft tissues. Patient is status post left total hip arthroplasty. The left hip arthroplasty hardware appears in expected alignment without obvious adverse features. Adjacent soft tissue staples and surgical drain. IMPRESSION: Significantly limited examination as described. Status post left total hip  arthroplasty without obvious adverse features. Electronically Signed   By: Kellie Simmering   On: 09/22/2018 12:35    Disposition:     Follow-up Information    Duanne Guess, PA-C Follow up in 2 week(s).   Specialties: Orthopedic Surgery, Emergency Medicine Contact information: [place] [address] [phone]            Signed: Feliberto Gottron 09/25/2018, 7:49 AM

## 2018-09-25 NOTE — TOC Transition Note (Signed)
Transition of Care [facility]) - CM/SW Discharge Note   Patient Details  Name: Colton Choi MRN: [id number] Date of Birth: 08/24/1965  [facility]) CM/SW Contact:  Su Hilt, RN Phone Number: 09/25/2018, 9:03 AM   Clinical Narrative:    Patient will DC home with home health [facility] and Bariatric RW and BSC to be delivered to his home per [facility] with [employer], NO other needs   Final next level of care: Home w Home Health Services Barriers to Discharge: Barriers Resolved   Patient Goals and CMS Choice Patient states their goals for this hospitalization and ongoing recovery are:: go home CMS Medicare.gov Compare Post Acute Care list provided to:: Patient Choice offered to / list presented to : Patient  Discharge Placement                       Discharge Plan and Services   Discharge Planning Services: CM Consult Post Acute Care Choice: Home Health          DME Arranged: 3-N-1, Rollene Rotunda DME Agency: [employer] Date DME Agency Contacted: 09/23/18 Time DME Agency Contacted: 41 Representative spoke with at DME Agency: [place]: PT [place]: [facility] (formerly [employer]) Date [place]: 09/23/18 Time [place]: 1224 Representative spoke with at [place]: [place] ([place]) Interventions     Readmission Risk Interventions No flowsheet data found.

## 2018-09-25 NOTE — Progress Notes (Signed)
Physical Therapy Treatment Patient Details Name: Colton Choi MRN: [id number] DOB: December 22, 1965 Today's Date: 09/25/2018    History of Present Illness Pt is a 53 yo male diagnosed with OA of the left hip and is s/p elective L THA.  PMH includes: HTN, A-fib, R ankle fx, lymphoma, CKD, DM, and HLD.    PT Comments    Participated in exercises as described below.  OOB with HOB raised and rail.  Stated he plans to sleep in recliner initially upon going home.  Stood to walker with supervision and was able to advance gait to 30' with good PWB.  Stated he felt comfortable with crutches on stairs.  No further questions or concerns at this time.  Pt stated he felt ready for discharge home with sister.   Follow Up Recommendations  Home health PT     Equipment Recommendations  Rolling walker with 5" wheels;Crutches    Recommendations for Other Services       Precautions / Restrictions Precautions Precautions: Anterior Hip;Fall Restrictions Weight Bearing Restrictions: Yes LLE Weight Bearing: Partial weight bearing LLE Partial Weight Bearing Percentage or Pounds: 50%    Mobility  Bed Mobility Overal bed mobility: Modified Independent Bed Mobility: Supine to Sit     Supine to sit: Supervision;HOB elevated        Transfers Overall transfer level: Needs assistance Equipment used: Rolling walker (2 wheeled) Transfers: Sit to/from Stand Sit to Stand: Supervision            Ambulation/Gait Ambulation/Gait assistance: Min Gaffer (Feet): 45 Feet Assistive device: Rolling walker (2 wheeled) Gait Pattern/deviations: Step-to pattern Gait velocity: decreased   General Gait Details: Good carryover with proper sequencing for LLE PWB compliance with min verbal cues required this session to push walker rather than lift it up to advance.   Stairs         General stair comments: reported feeling comfortable from training yesterday.   Wheelchair Mobility    Modified Rankin (Stroke Patients Only)       Balance Overall balance assessment: Modified Independent                                          Cognition Arousal/Alertness: Awake/alert Behavior During Therapy: WFL for tasks assessed/performed Overall Cognitive Status: Within Functional Limits for tasks assessed                                        Exercises Other Exercises Other Exercises: HEP reviewe for supine ex - ankle pumps, quad sets, heel slides and glut sets.    General Comments        Pertinent Vitals/Pain Pain Assessment: 0-10 Pain Score: 2  Pain Location: L hip Pain Descriptors / Indicators: Sore Pain Intervention(s): Limited activity within patient's tolerance;Monitored during session    Home Living                      Prior Function            PT Goals (current goals can now be found in the care plan section)      Frequency    BID      PT Plan Current plan remains appropriate    Co-evaluation  AM-PAC PT "6 Clicks" Mobility   Outcome Measure  Help needed turning from your back to your side while in a flat bed without using bedrails?: A Little Help needed moving from lying on your back to sitting on the side of a flat bed without using bedrails?: A Little Help needed moving to and from a bed to a chair (including a wheelchair)?: None Help needed standing up from a chair using your arms (e.g., wheelchair or bedside chair)?: None Help needed to walk in hospital room?: A Little Help needed climbing 3-5 steps with a railing? : A Little 6 Click Score: 20    End of Session Equipment Utilized During Treatment: Gait belt Activity Tolerance: Patient tolerated treatment well Patient left: in chair;with call bell/phone within reach;with chair alarm set;with nursing/sitter in room Nurse Communication: Mobility status       Time: 5929-2446 PT Time Calculation (min) (ACUTE ONLY): 13  min  Charges:  $Gait Training: 8-22 mins                     Chesley Noon, PTA 09/25/18, 9:22 AM

## 2018-09-25 NOTE — Progress Notes (Signed)
Discharge summary reviewed with verbal understanding. Answered all questions. Wound vac converted to prevena. Dressings provided.

## 2018-12-21 ENCOUNTER — Other Ambulatory Visit: Payer: Self-pay

## 2018-12-21 ENCOUNTER — Ambulatory Visit
Admission: EM | Admit: 2018-12-21 | Discharge: 2018-12-21 | Disposition: A | Discharge status: Home / Self Care | Payer: Managed Care, Other (non HMO) | Attending: Physician Assistant | Admitting: Physician Assistant

## 2018-12-21 DIAGNOSIS — R197 Diarrhea, unspecified: Secondary | ICD-10-CM | POA: Diagnosis not present

## 2018-12-21 DIAGNOSIS — R6883 Chills (without fever): Secondary | ICD-10-CM

## 2018-12-21 DIAGNOSIS — L03116 Cellulitis of left lower limb: Secondary | ICD-10-CM | POA: Diagnosis not present

## 2018-12-21 DIAGNOSIS — R52 Pain, unspecified: Secondary | ICD-10-CM

## 2018-12-21 DIAGNOSIS — Z20828 Contact with and (suspected) exposure to other viral communicable diseases: Secondary | ICD-10-CM

## 2018-12-21 LAB — POCT URINALYSIS DIP (MANUAL ENTRY)
Glucose, UA: 500 mg/dL — AB
Ketones, POC UA: NEGATIVE mg/dL
Leukocytes, UA: NEGATIVE
Nitrite, UA: NEGATIVE
Protein Ur, POC: 30 mg/dL — AB
Spec Grav, UA: 1.025 (ref 1.010–1.025)
Urobilinogen, UA: 1 E.U./dL
pH, UA: 5.5 (ref 5.0–8.0)

## 2018-12-21 LAB — POCT FASTING CBG KUC MANUAL ENTRY: POCT Glucose (KUC): 384 mg/dL — AB (ref 70–99)

## 2018-12-21 MED ORDER — CEFTRIAXONE SODIUM 1 G IJ SOLR
1.0000 g | Freq: Once | INTRAMUSCULAR | Status: AC
  Administered 2018-12-21: 13:00:00 1 g via INTRAMUSCULAR

## 2018-12-21 MED ORDER — SODIUM CHLORIDE 0.9 % IV BOLUS
1000.0000 mL | Freq: Once | INTRAVENOUS | Status: AC
  Administered 2018-12-21: 1000 mL via INTRAVENOUS

## 2018-12-21 MED ORDER — DOXYCYCLINE HYCLATE 100 MG PO CAPS: 100.0000 mg | ORAL_CAPSULE | Freq: Two times a day (BID) | ORAL | 0 refills | Status: DC

## 2018-12-21 NOTE — ED Triage Notes (Signed)
Pt c/o fatigue, chills and body aches since last Wednesday night. States on Saturday he developed diarrhea, nausea, lost of appetite, cellulitis to lt lower leg.

## 2018-12-21 NOTE — ED Provider Notes (Signed)
[facility]    CSN: [id number] Arrival date & time: 12/21/18  [id number]     History   Chief Complaint Chief Complaint  Patient presents with  . Diarrhea    HPI Colton Choi is a 53 y.o. male.   53 year old male with history of A. fib on Xarelto, Hodgkin's lymphoma in remission since 2017, diabetes mellitus, HTN, HLD comes in for 5-day history of fatigue, chills, body aches and 3-day history of diarrhea, nausea, loss of appetite, left lower leg swelling with erythema.  Patient states left leg swelling and erythema for started to the left upper thigh, but has now spread to the left lower leg.  States left upper thigh erythema was mildly tender, but is now asymptomatic, though rash/erythema still present.  Left lower leg swelling is erythematous, and painful to palpation.  He denies known fever.  Denies URI symptoms such as cough, congestion, sore throat.  He has had nausea with dry heaving, with decreased appetite.  However, still able to tolerate oral intake.  Has had right upper quadrant pain associated with dry heaving.  Denies chest pain, shortness of breath.  Denies loss of taste or smell.  No sick/Covid contact.  Patient with left hip replacement 09/2018, states Xarelto was not stopped during surgery.  Since surgery, he has been more active.  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  Denies history of blood clot.  Last A1c 7.4.      Past Medical History:  Diagnosis Date  . A-fib ([place])   . Arthritis   . Broken ankle 20 years ago   Right  . Cancer [facility]) 2017   lymphoma  . Chronic kidney disease   . Diabetes mellitus without complication ([place])   . Dysrhythmia   . Gout   . Hyperlipidemia   . Hypertension     Patient Active Problem List   Diagnosis Date Noted  . 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 Swelling of left lower extremity 10/10/2014  . Hip pain 10/10/2014  . Hodgkin lymphoma, nodular  lymphocyte predominance ([place]) 05/04/2014  . Lymphadenopathy, inguinal 03/14/2014  . Combined fat and carbohydrate induced hyperlipemia 11/20/2013  . Diabetes ([place]) 11/20/2013  . Benign hypertension 11/20/2013  . Body mass index of 60 or higher 11/20/2013  . OP (osteoporosis) 11/20/2013    Past Surgical History:  Procedure Laterality Date  . ANKLE SURGERY  1996  . left grion excision  03/15/2014    for lymphoma-had radiation only  . TOTAL HIP ARTHROPLASTY Left 09/22/2018   Procedure: TOTAL HIP ARTHROPLASTY ANTERIOR APPROACH;  Surgeon: Hessie Knows, MD;  Location: [facility] ORS;  Service: Orthopedics;  Laterality: Left;       Home Medications    Prior to Admission medications   Medication Sig Start Date End Date Taking? Authorizing Provider  atorvastatin (LIPITOR) 40 MG tablet Take 40 mg by mouth every morning.     [provider]  doxycycline (VIBRAMYCIN) 100 MG capsule Take 1 capsule (100 mg total) by mouth 2 (two) times daily. 12/21/18   Ok Edwards, PA-C  glimepiride (AMARYL) 2 MG tablet Take 2 mg by mouth daily with breakfast.  04/04/14 09/09/20  [provider]  HYDROcodone-acetaminophen (NORCO) 7.5-325 MG tablet Take 1-2 tablets by mouth every 4 (four) hours as needed for severe pain (pain score 7-10). 09/25/18   Duanne Guess, PA-C  lisinopril-hydrochlorothiazide (ZESTORETIC) 20-12.5 MG tablet Take 1 tablet by mouth every morning.  [provider]  metFORMIN (GLUCOPHAGE) 500 MG tablet Take 1,000 mg by mouth daily with breakfast.     [provider]  methocarbamol (ROBAXIN) 500 MG tablet Take 1 tablet (500 mg total) by mouth every 6 (six) hours as needed for muscle spasms. 09/25/18   Duanne Guess, PA-C  metoprolol (TOPROL-XL) 200 MG 24 hr tablet Take 200 mg by mouth every morning.     [provider]  rivaroxaban (XARELTO) 20 MG TABS tablet Take 20 mg by mouth daily with supper.    [provider]  TURMERIC PO Take 1 capsule  by mouth daily.    [provider]    Family History Family History  Problem Relation Age of Onset  . Hypertension Mother   . Diabetes Sister   . Hypertension Sister   . Diabetes Maternal Grandmother     Social History Social History   Tobacco Use  . Smoking status: Former Smoker    Packs/day: 1.00    Years: 15.00    Pack years: 15.00    Types: Cigarettes    Quit date: 02/12/2012    Years since quitting: 6.8  . Smokeless tobacco: Never Used  Substance Use Topics  . Alcohol use: Yes    Alcohol/week: 0.0 standard drinks    Comment: beer occ  . Drug use: No     Allergies   Sulfa antibiotics   Review of Systems Review of Systems  Reason unable to perform ROS: See HPI as above.     Physical Exam Triage Vital Signs ED Triage Vitals  Enc Vitals Group     BP 12/21/18 1020 105/62     Pulse Rate 12/21/18 1020 72     Resp 12/21/18 1020 18     Temp 12/21/18 1020 (!) 97.5 F (36.4 C)     Temp Source 12/21/18 1020 Oral     SpO2 12/21/18 1020 97 %     Weight --      Height --      Head Circumference --      Peak Flow --      Pain Score 12/21/18 1021 0     Pain Loc --      Pain Edu? --      Excl. in [place]? --    No data found.  Updated Vital Signs BP 92/60 (BP Location: Right Arm)   Pulse 65   Temp (!) 97.5 F (36.4 C) (Oral)   Resp 18   SpO2 97%   Physical Exam Constitutional:      General: He is not in acute distress.    Appearance: Normal appearance. He is not ill-appearing, toxic-appearing or diaphoretic.  HENT:     Head: Normocephalic and atraumatic.     Mouth/Throat:     Mouth: Mucous membranes are moist.     Pharynx: Oropharynx is clear. Uvula midline.  Neck:     Musculoskeletal: Normal range of motion and neck supple.  Cardiovascular:     Rate and Rhythm: Normal rate and regular rhythm.     Heart sounds: Normal heart sounds. No murmur. No friction rub. No gallop.   Pulmonary:     Effort: Pulmonary effort is normal. No accessory muscle  usage, prolonged expiration, respiratory distress or retractions.     Comments: Lungs clear to auscultation without adventitious lung sounds. Musculoskeletal:     Comments: See picture below.  Surgical site for noted to the left anterior hip.  There is erythema to the lateral of  scar without warmth or tenderness to palpation.  Patient with full range of motion of hip, with no pain during range of motion.  Strength normal ankle bilaterally.  Sensation intact and equal bilaterally.  Rash/erythema to upper and lower left leg.  1+ pitting edema noted to the distal lower leg.  Mild tenderness to palpation of distal lower leg.  Otherwise no tenderness to palpation of the knee, ankle.  Full range of motion.  Strength normal ankle bilaterally.  Sensation intact ankle bilaterally.  Pedal pulse 2+.  Negative Homans.  Skin:    General: Skin is warm and dry.  Neurological:     General: No focal deficit present.     Mental Status: He is alert and oriented to person, place, and time.               UC Treatments / Results  Labs (all labs ordered are listed, but only abnormal results are displayed) Labs Reviewed  POCT URINALYSIS DIP (MANUAL ENTRY) - Abnormal; Notable for the following components:      Result Value   Color, UA orange (*)    Glucose, UA =500 (*)    Bilirubin, UA small (*)    Blood, UA large (*)    Protein Ur, POC =30 (*)    All other components within normal limits  POCT FASTING CBG K[facility] MANUAL ENTRY - Abnormal; Notable for the following components:   POCT Glucose ([facility]) 384 (*)    All other components within normal limits  NOVEL CORONAVIRUS, NAA  HEPATIC FUNCTION PANEL  BASIC METABOLIC PANEL  HEMOGLOBIN A1C  LIPID PANEL    EKG   Radiology No results found.  Procedures Procedures (including critical care time)  Medications Ordered in UC Medications  sodium chloride 0.9 % bolus 1,000 mL (0 mLs Intravenous Stopped 12/21/18 1257)  cefTRIAXone (ROCEPHIN) injection  1 g (1 g Intramuscular Given 12/21/18 1257)    Initial Impression / Assessment and Plan / UC Course  I have reviewed the triage vital signs and the nursing notes.  Pertinent labs & imaging results that were available during my care of the patient were reviewed by me and considered in my medical decision making (see chart for details).    Discussed case with Dr. Mannie Stabile.  53 year old male with history of A. fib on Xarelto, DM, Hodgkin's lymphoma in remission, recent left hip surgery 09/2018 comes in for left lower leg swelling, erythema.  Also having body aches, chills, diarrhea, nausea, vomiting.  Left leg exam worrisome for cellulitis versus DVT.  Given no cords felt, no significant erythema to calf, with most significant erythema to anterior lower leg, lower suspicion for DVT at this time.  No suspicion for septic joint, infected surgical wound given exam.  Discussed Covid testing given other symptoms, and treating for cellulitis.   Patient due for lab work, had scheduled appointment with PCP for blood draw tomorrow.  However, given testing for Covid, will have patient remain in quarantine, and will obtain blood work here at this time.  Patient ambulated to restroom, and felt diaphoretic and chills.  BP was found to be 97/63.  CBG 384.  Patient denies chest pain, shortness of breath, weakness, dizziness. Discussed discharge to ED for further evaluation given current symptoms. Patient would like to defer ED transfer at this time.  Will start IV fluids and recheck.  Patient with significant improvement of symptoms after IV fluids.  BP still low at 92/60.  However, no longer diaphoretic.  Continues to denies  chest pain, shortness of breath.  Again discussed with Dr. Mannie Stabile, was worrisome for possible sepsis.  Given patient with improvement of symptoms, other vital signs stable, Dr. Mannie Stabile suggested continued monitoring at this time.  Will provide patient with Rocephin injection.  Will treat with for  cellulitis with doxycycline.  Patient to continue to monitor blood pressure and blood glucose at home.  Strict return precautions given.  Otherwise we will touch base with patient tomorrow.  Patient expresses understanding and agrees to plan.  Final Clinical Impressions(s) / UC Diagnoses   Final diagnoses:  Cellulitis of left lower extremity  Body aches  Chills  Diarrhea, unspecified type    ED Prescriptions    Medication Sig Dispense Auth. Provider   doxycycline (VIBRAMYCIN) 100 MG capsule Take 1 capsule (100 mg total) by mouth 2 (two) times daily. 20 capsule Ok Edwards, PA-C     PDMP not reviewed this encounter.   Ok Edwards, PA-C 12/21/18 1654

## 2018-12-21 NOTE — Discharge Instructions (Addendum)
As discussed, cannot rule out COVID. Currently, no alarming signs. Testing ordered. I would like you to quarantine until testing results.   Rocephin injection in office today. Start doxycycline for cellulitis. Warm compress to the legs. Labs are drawn, you will see results on mychart in 24-48 hours. Discontinue your blood pressure medicines at this time. Continue to monitor your blood sugar and blood pressure. Contact us with any questions, otherwise, we will touch base with you tomorrow. Any worsening symptoms, please go to the emergency department for further evaluation needed.

## 2018-12-22 ENCOUNTER — Telehealth: Payer: Self-pay | Admitting: Physician Assistant

## 2018-12-22 LAB — HEPATIC FUNCTION PANEL
ALT: 20 IU/L (ref 0–44)
AST: 31 IU/L (ref 0–40)
Albumin: 3.3 g/dL — ABNORMAL LOW (ref 3.8–4.9)
Alkaline Phosphatase: 89 IU/L (ref 39–117)
Bilirubin Total: 0.5 mg/dL (ref 0.0–1.2)
Bilirubin, Direct: 0.27 mg/dL (ref 0.00–0.40)
Total Protein: 6.8 g/dL (ref 6.0–8.5)

## 2018-12-22 LAB — BASIC METABOLIC PANEL
BUN/Creatinine Ratio: 16 (ref 9–20)
BUN: 30 mg/dL — ABNORMAL HIGH (ref 6–24)
CO2: 19 mmol/L — ABNORMAL LOW (ref 20–29)
Calcium: 8.3 mg/dL — ABNORMAL LOW (ref 8.7–10.2)
Chloride: 89 mmol/L — ABNORMAL LOW (ref 96–106)
Creatinine, Ser: 1.82 mg/dL — ABNORMAL HIGH (ref 0.76–1.27)
GFR calc Af Amer: 48 mL/min/{1.73_m2} — ABNORMAL LOW (ref >59)
GFR calc non Af Amer: 42 mL/min/{1.73_m2} — ABNORMAL LOW (ref >59)
Glucose: 406 mg/dL — ABNORMAL HIGH (ref 65–99)
Potassium: 3.7 mmol/L (ref 3.5–5.2)
Sodium: 128 mmol/L — ABNORMAL LOW (ref 134–144)

## 2018-12-22 LAB — HEMOGLOBIN A1C
Est. average glucose Bld gHb Est-mCnc: 169 mg/dL
Hgb A1c MFr Bld: 7.5 % — ABNORMAL HIGH (ref 4.8–5.6)

## 2018-12-22 LAB — LIPID PANEL
Chol/HDL Ratio: 17.4 ratio — ABNORMAL HIGH (ref 0.0–5.0)
Cholesterol, Total: 122 mg/dL (ref 100–199)
HDL: 7 mg/dL — ABNORMAL LOW (ref >39)
LDL Chol Calc (NIH): 53 mg/dL (ref 0–99)
Triglycerides: 409 mg/dL — ABNORMAL HIGH (ref 0–149)
VLDL Cholesterol Cal: 62 mg/dL — ABNORMAL HIGH (ref 5–40)

## 2018-12-22 LAB — NOVEL CORONAVIRUS, NAA: SARS-CoV-2, NAA: NOT DETECTED

## 2018-12-22 NOTE — Telephone Encounter (Signed)
Reviewed patient's labwork. Patient with creatinine 2x last blood work 2 months ago. Given labwork with infection, will need further evaluation and the emergency department. Attempted to call patient, no answer, left voicemail to call back.

## 2018-12-22 NOTE — Telephone Encounter (Signed)
Patient recurrent called to go through labs.  Blood pressure has improved since visit yesterday, systolic in the [id number].  Still having pain to the left lower leg, but has been taking doxycycline as directed. Discussed that creatinine doubled, and to go to the emergency department for further evaluation. Patient expresses understanding and agrees to plan.

## 2019-02-08 ENCOUNTER — Encounter: Payer: Self-pay | Admitting: Emergency Medicine

## 2019-02-08 ENCOUNTER — Ambulatory Visit
Admission: EM | Admit: 2019-02-08 | Discharge: 2019-02-08 | Disposition: A | Discharge status: Home / Self Care | Payer: Managed Care, Other (non HMO) | Attending: Emergency Medicine | Admitting: Emergency Medicine

## 2019-02-08 ENCOUNTER — Other Ambulatory Visit: Payer: Self-pay

## 2019-02-08 DIAGNOSIS — Z20828 Contact with and (suspected) exposure to other viral communicable diseases: Secondary | ICD-10-CM | POA: Diagnosis not present

## 2019-02-08 DIAGNOSIS — R52 Pain, unspecified: Secondary | ICD-10-CM | POA: Diagnosis not present

## 2019-02-08 DIAGNOSIS — J3489 Other specified disorders of nose and nasal sinuses: Secondary | ICD-10-CM | POA: Diagnosis not present

## 2019-02-08 DIAGNOSIS — R519 Headache, unspecified: Secondary | ICD-10-CM | POA: Diagnosis not present

## 2019-02-08 DIAGNOSIS — Z20822 Contact with and (suspected) exposure to covid-19: Secondary | ICD-10-CM

## 2019-02-08 NOTE — ED Triage Notes (Signed)
Pt presents to [facility] for assessment of 24 hours of body aches, chills, nasal congestion.  Denies cough.  C/o diarrhea  Decreased sense of smell, decreased appetite.

## 2019-02-08 NOTE — ED Provider Notes (Signed)
[facility]    CSN: [id number] Arrival date & time: 02/08/19  1011      History   Chief Complaint Chief Complaint  Patient presents with   Flu-Like Symptoms    HPI Colton Choi is a 53 y.o. male with history of A. fib, lymphoma, CKD second to DM, hypertension   Est Presenting for Covid testing: Exposure: Dairy from her son and their whole family Date of exposure: Twice weekly, last time was 5 days ago Any fever, symptoms since exposure: Yes-24-hour course of myalgias, subjective chills, rhinorrhea, frontal headache, decreased sense of smell and appetite.  No cough, fever, shortness of breath, chest pain.  Has not taken anything for symptoms.   Past Medical History:  Diagnosis Date   A-fib [facility])    Arthritis    Broken ankle 20 years ago   Right   Cancer ([place]) 2017   lymphoma   Chronic kidney disease    Diabetes mellitus without complication ([place])    Dysrhythmia    Gout    Hyperlipidemia    Hypertension     Patient Active Problem List   Diagnosis Date Noted   Status post total hip replacement, left 09/22/2018   Swelling of left lower extremity 10/10/2014   Hip pain 10/10/2014   Hodgkin lymphoma, nodular lymphocyte predominance ([place]) 05/04/2014   Lymphadenopathy, inguinal 03/14/2014   Combined fat and carbohydrate induced hyperlipemia 11/20/2013   Diabetes ([place]) 11/20/2013   Benign hypertension 11/20/2013   Body mass index of 60 or higher 11/20/2013   OP (osteoporosis) 11/20/2013    Past Surgical History:  Procedure Laterality Date   ANKLE SURGERY  1996   left grion excision  03/15/2014    for lymphoma-had radiation only   TOTAL HIP ARTHROPLASTY Left 09/22/2018   Procedure: TOTAL HIP ARTHROPLASTY ANTERIOR APPROACH;  Surgeon: Hessie Knows, MD;  Location: [facility] ORS;  Service: Orthopedics;  Laterality: Left;       Home Medications    Prior to Admission medications   Medication Sig Start Date End Date Taking?  Authorizing Provider  atorvastatin (LIPITOR) 40 MG tablet Take 40 mg by mouth every morning.     [provider]  glimepiride (AMARYL) 2 MG tablet Take 2 mg by mouth daily with breakfast.  04/04/14 09/09/20  [provider]  lisinopril-hydrochlorothiazide (ZESTORETIC) 20-12.5 MG tablet Take 1 tablet by mouth every morning.     [provider]  metFORMIN (GLUCOPHAGE) 500 MG tablet Take 1,000 mg by mouth daily with breakfast.     [provider]  methocarbamol (ROBAXIN) 500 MG tablet Take 1 tablet (500 mg total) by mouth every 6 (six) hours as needed for muscle spasms. 09/25/18   Duanne Guess, PA-C  metoprolol (TOPROL-XL) 200 MG 24 hr tablet Take 200 mg by mouth every morning.     [provider]  rivaroxaban (XARELTO) 20 MG TABS tablet Take 20 mg by mouth daily with supper.    [provider]  TURMERIC PO Take 1 capsule by mouth daily.    [provider]    Family History Family History  Problem Relation Age of Onset   Hypertension Mother    Diabetes Sister    Hypertension Sister    Diabetes Maternal Grandmother     Social History Social History   Tobacco Use   Smoking status: Former Smoker    Packs/day: 1.00    Years: 15.00    Pack years: 15.00    Types: Cigarettes  Quit date: 02/12/2012    Years since quitting: 6.9   Smokeless tobacco: Never Used  Substance Use Topics   Alcohol use: Yes    Alcohol/week: 0.0 standard drinks    Comment: beer occ   Drug use: No     Allergies   Sulfa antibiotics   Review of Systems Review of Systems  Constitutional: Positive for appetite change. Negative for activity change, fatigue and fever.  HENT: Positive for rhinorrhea. Negative for congestion, dental problem, ear pain, facial swelling, hearing loss, sinus pain, sore throat, trouble swallowing and voice change.   Eyes: Negative for photophobia, pain and visual disturbance.  Respiratory: Negative for cough,  shortness of breath and wheezing.   Cardiovascular: Negative for chest pain and palpitations.  Gastrointestinal: Negative for diarrhea and vomiting.  Musculoskeletal: Positive for myalgias. Negative for arthralgias, back pain, gait problem, neck pain and neck stiffness.  Neurological: Positive for headaches. Negative for dizziness, facial asymmetry and weakness.     Physical Exam Triage Vital Signs ED Triage Vitals  Enc Vitals Group     BP      Pulse      Resp      Temp      Temp src      SpO2      Weight      Height      Head Circumference      Peak Flow      Pain Score      Pain Loc      Pain Edu?      Excl. in [place]?    No data found.  Updated Vital Signs BP 117/63 (BP Location: Left Arm)    Pulse 84    Temp 99.3 F (37.4 C) (Temporal)    Resp 18    SpO2 94%   Visual Acuity Right Eye Distance:   Left Eye Distance:   Bilateral Distance:    Right Eye Near:   Left Eye Near:    Bilateral Near:     Physical Exam Constitutional:      General: He is not in acute distress.    Appearance: He is obese. He is not toxic-appearing or diaphoretic.  HENT:     Head: Normocephalic and atraumatic.     Mouth/Throat:     Mouth: Mucous membranes are moist.     Pharynx: Oropharynx is clear.  Eyes:     General: No scleral icterus.    Conjunctiva/sclera: Conjunctivae normal.     Pupils: Pupils are equal, round, and reactive to light.  Cardiovascular:     Rate and Rhythm: Normal rate and regular rhythm.     Heart sounds: No murmur. No gallop.   Pulmonary:     Effort: Pulmonary effort is normal. No respiratory distress.     Breath sounds: No stridor. No wheezing, rhonchi or rales.     Comments: Good air entry bilaterally without prolonged expiratory phase.  SPO2 97% at bedside Musculoskeletal:        General: Normal range of motion.     Cervical back: Neck supple. No tenderness.     Right lower leg: No edema.     Left lower leg: No edema.  Lymphadenopathy:     Cervical: No  cervical adenopathy.  Skin:    Capillary Refill: Capillary refill takes less than 2 seconds.     Coloration: Skin is not jaundiced or pale.  Neurological:     Mental Status: He is alert and oriented to person, place, and  time.      UC Treatments / Results  Labs (all labs ordered are listed, but only abnormal results are displayed) Labs Reviewed  NOVEL CORONAVIRUS, NAA    EKG   Radiology No results found.  Procedures Procedures (including critical care time)  Medications Ordered in UC Medications - No data to display  Initial Impression / Assessment and Plan / UC Course  I have reviewed the triage vital signs and the nursing notes.  Pertinent labs & imaging results that were available during my care of the patient were reviewed by me and considered in my medical decision making (see chart for details).     Patient afebrile, nontoxic, with SpO2 97%.  Covid PCR pending.  Patient to quarantine until results are back.  Reviewed supportive management.  Return precautions discussed, patient verbalized understanding and is agreeable to plan. Final Clinical Impressions(s) / UC Diagnoses   Final diagnoses:  Body aches  Exposure to COVID-19 virus     Discharge Instructions     Your COVID test is pending - it is important to quarantine / isolate at home until your results are back. If you test positive and would like further evaluation for persistent or worsening symptoms, you may schedule an E-visit or virtual (video) visit throughout the [facility] app or website.  PLEASE NOTE: If you develop severe chest pain or shortness of breath please go to the ER or call 9-1-1 for further evaluation --> DO NOT schedule electronic or virtual visits for this. Please call our office for further guidance / recommendations as needed.    ED Prescriptions    None     PDMP not reviewed this encounter.   [place], [place], [place] 02/08/19 1106

## 2019-02-08 NOTE — Discharge Instructions (Signed)
Your COVID test is pending - it is important to quarantine / isolate at home until your results are back. °If you test positive and would like further evaluation for persistent or worsening symptoms, you may schedule an E-visit or virtual (video) visit throughout the [facility] MyChart app or website. ° °PLEASE NOTE: If you develop severe chest pain or shortness of breath please go to the ER or call 9-1-1 for further evaluation --> DO NOT schedule electronic or virtual visits for this. °Please call our office for further guidance / recommendations as needed. °

## 2019-02-10 LAB — NOVEL CORONAVIRUS, NAA: SARS-CoV-2, NAA: NOT DETECTED

## 2019-03-19 ENCOUNTER — Encounter: Payer: Self-pay | Admitting: Emergency Medicine

## 2019-03-19 ENCOUNTER — Ambulatory Visit
Admission: EM | Admit: 2019-03-19 | Discharge: 2019-03-19 | Disposition: A | Discharge status: Home / Self Care | Payer: Managed Care, Other (non HMO) | Attending: Physician Assistant | Admitting: Physician Assistant

## 2019-03-19 ENCOUNTER — Other Ambulatory Visit: Payer: Self-pay

## 2019-03-19 DIAGNOSIS — L03317 Cellulitis of buttock: Secondary | ICD-10-CM | POA: Diagnosis not present

## 2019-03-19 MED ORDER — AMOXICILLIN-POT CLAVULANATE 875-125 MG PO TABS: 1.0000 | ORAL_TABLET | Freq: Two times a day (BID) | ORAL | 0 refills | Status: DC

## 2019-03-19 NOTE — Discharge Instructions (Signed)
Start Augmentin as directed. Warm compress. Monitor for spreading redness, warmth, fever.

## 2019-03-19 NOTE — ED Triage Notes (Signed)
Pt presents to [facility] for assessment of redness to buttocks area, hx of cellulitis.  Patient states it began to develop again yesterday, states he has had this multiple times before since he was treated by radiation to the area for non-hodgkin's lymphoma in 2017.

## 2019-03-19 NOTE — ED Provider Notes (Signed)
[facility]    CSN: [id number] Arrival date & time: 03/19/19  [id number]      History   Chief Complaint Chief Complaint  Patient presents with  . Cellulitis    HPI Colton Choi is a 54 y.o. male.   55 year old male with history of A. fib on Xarelto, Hodgkin's lymphoma in remission since 2017, non-insulin-dependent DM, HTN, HLD comes in for 1 day history of possible cellulitis.  Patient gets frequent cellulitis, especially to the pilonidal area as received radiation to that area during cancer treatment.  Patient also a truck driver, and sits for long hours a day.  States yesterday, started feeling chills, and found the pilonidal area to be erythematous, warm, tender to palpation.  Has not noticed spreading since yesterday.  Denies fever, body aches.  Denies nausea, vomiting.  Has not taken anything for symptoms.  Last A1c 8.6, was increased on Metformin to obtain better control of diabetes.     Past Medical History:  Diagnosis Date  . A-fib ([place])   . Arthritis   . Broken ankle 20 years ago   Right  . Cancer [facility]) 2017   lymphoma  . Chronic kidney disease   . Diabetes mellitus without complication ([place])   . Dysrhythmia   . Gout   . Hyperlipidemia   . Hypertension     Patient Active Problem List   Diagnosis Date Noted  . 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 Swelling of left lower extremity 10/10/2014  . Hip pain 10/10/2014  . 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 Lymphadenopathy, inguinal 03/14/2014  . Combined fat and carbohydrate induced hyperlipemia 11/20/2013  . Diabetes ([place]) 11/20/2013  . Benign hypertension 11/20/2013  . Body mass index of 60 or higher 11/20/2013  . OP (osteoporosis) 11/20/2013    Past Surgical History:  Procedure Laterality Date  . ANKLE SURGERY  1996  . left grion excision  03/15/2014    for lymphoma-had radiation only  . TOTAL HIP ARTHROPLASTY Left 09/22/2018   Procedure: TOTAL  HIP ARTHROPLASTY ANTERIOR APPROACH;  Surgeon: Hessie Knows, MD;  Location: [facility] ORS;  Service: Orthopedics;  Laterality: Left;       Home Medications    Prior to Admission medications   Medication Sig Start Date End Date Taking? Authorizing Provider  amoxicillin-clavulanate (AUGMENTIN) 875-125 MG tablet Take 1 tablet by mouth every 12 (twelve) hours. 03/19/19   Tasia Catchings, Amy V, PA-C  atorvastatin (LIPITOR) 40 MG tablet Take 40 mg by mouth every morning.     [provider]  glimepiride (AMARYL) 2 MG tablet Take 2 mg by mouth daily with breakfast.  04/04/14 09/09/20  [provider]  lisinopril-hydrochlorothiazide (ZESTORETIC) 20-12.5 MG tablet Take 1 tablet by mouth every morning.     [provider]  metFORMIN (GLUCOPHAGE) 500 MG tablet Take 1,000 mg by mouth daily with breakfast.     [provider]  metoprolol (TOPROL-XL) 200 MG 24 hr tablet Take 200 mg by mouth every morning.     [provider]  rivaroxaban (XARELTO) 20 MG TABS tablet Take 20 mg by mouth daily with supper.    [provider]  TURMERIC PO Take 1 capsule by mouth daily.    [provider]    Family History Family History  Problem Relation Age of Onset  . Hypertension Mother   . Diabetes Sister   . Hypertension Sister   . Diabetes Maternal Grandmother     Social History  Social History   Tobacco Use  . Smoking status: Former Smoker    Packs/day: 1.00    Years: 15.00    Pack years: 15.00    Types: Cigarettes    Quit date: 02/12/2012    Years since quitting: 7.1  . Smokeless tobacco: Never Used  Substance Use Topics  . Alcohol use: Yes    Alcohol/week: 0.0 standard drinks    Comment: beer occ  . Drug use: No     Allergies   Sulfa antibiotics   Review of Systems Review of Systems  Reason unable to perform ROS: See HPI as above.     Physical Exam Triage Vital Signs ED Triage Vitals [03/19/19 0847]  Enc Vitals Group     BP (!) 154/83      Pulse Rate 71     Resp 18     Temp 98.4 F (36.9 C)     Temp Source Temporal     SpO2 95 %     Weight      Height      Head Circumference      Peak Flow      Pain Score 7     Pain Loc      Pain Edu?      Excl. in [place]?    No data found.  Updated Vital Signs BP (!) 154/83 (BP Location: Left Arm)   Pulse 71   Temp 98.4 F (36.9 C) (Temporal)   Resp 18   SpO2 95%   Physical Exam Constitutional:      General: He is not in acute distress.    Appearance: Normal appearance. He is well-developed. He is not toxic-appearing or diaphoretic.  HENT:     Head: Normocephalic and atraumatic.  Eyes:     Conjunctiva/sclera: Conjunctivae normal.     Pupils: Pupils are equal, round, and reactive to light.  Pulmonary:     Effort: Pulmonary effort is normal. No respiratory distress.     Comments: Speaking in full sentences without difficulty Musculoskeletal:     Cervical back: Normal range of motion and neck supple.  Skin:    General: Skin is warm and dry.     Comments: See picture below.  Cellulitis to the pilonidal area spreading to bilateral buttocks.  No cellulitis/pain to the rectum.  No signs of abscess.  Neurological:     Mental Status: He is alert and oriented to person, place, and time.        UC Treatments / Results  Labs (all labs ordered are listed, but only abnormal results are displayed) Labs Reviewed - No data to display  EKG   Radiology No results found.  Procedures Procedures (including critical care time)  Medications Ordered in UC Medications - No data to display  Initial Impression / Assessment and Plan / UC Course  I have reviewed the triage vital signs and the nursing notes.  Pertinent labs & imaging results that were available during my care of the patient were reviewed by me and considered in my medical decision making (see chart for details).    Given cellulitis to the pilonidal area/buttock, will start Augmentin at this time.  Warm compress.   Given frequent cellulitis discussed following up with PCP/dermatology for further evaluation and management needed.  Return precautions given.  Patient expresses understanding and agrees to plan.  Final Clinical Impressions(s) / UC Diagnoses   Final diagnoses:  Cellulitis of buttock   ED Prescriptions    Medication Sig Dispense  Auth. Provider   amoxicillin-clavulanate (AUGMENTIN) 875-125 MG tablet Take 1 tablet by mouth every 12 (twelve) hours. 14 tablet Ok Edwards, PA-C     PDMP not reviewed this encounter.   Ok Edwards, PA-C 03/19/19 [phone]

## 2019-03-19 NOTE — ED Notes (Signed)
Patient able to ambulate independently  

## 2019-05-13 ENCOUNTER — Other Ambulatory Visit: Payer: Self-pay

## 2019-05-13 ENCOUNTER — Ambulatory Visit (INDEPENDENT_AMBULATORY_CARE_PROVIDER_SITE_OTHER)
Admission: RE | Admit: 2019-05-13 | Discharge: 2019-05-13 | Disposition: A | Discharge status: Home / Self Care | Payer: Managed Care, Other (non HMO) | Source: Ambulatory Visit

## 2019-05-13 DIAGNOSIS — L03116 Cellulitis of left lower limb: Secondary | ICD-10-CM

## 2019-05-13 DIAGNOSIS — L03317 Cellulitis of buttock: Secondary | ICD-10-CM

## 2019-05-13 MED ORDER — CEPHALEXIN 500 MG PO CAPS: 500.0000 mg | ORAL_CAPSULE | Freq: Four times a day (QID) | ORAL | 0 refills | Status: AC

## 2019-05-13 NOTE — Discharge Instructions (Signed)
Take the antibiotic as directed.    Follow up with your primary care provider or come here to be seen in person if your symptoms are not improving.    

## 2019-05-13 NOTE — ED Provider Notes (Signed)
Virtual Visit via Video Note:  [name]  initiated request for Telemedicine visit with [facility] team. I connected with Su Grand  on 05/13/2019 at 10:16 AM  for a synchronized telemedicine visit using a video enabled HIPPA compliant telemedicine application. I verified that I am speaking with Su Grand  using two identifiers. Sharion Balloon, NP  was physically located in a [facility] Urgent care site and JAWARA SCHUCHART was located at a different location.   The limitations of evaluation and management by telemedicine as well as the availability of in-person appointments were discussed. Patient was informed that he  may incur a bill ( including co-pay) for this virtual visit encounter. Su Grand  expressed understanding and gave verbal consent to proceed with virtual visit.     History of Present Illness:Colton Choi  is a 54 y.o. male presents for evaluation of cellulitis on left foot with fever and chills x 1 day.  His left foot is swollen, red, and mildly painful.  Tmax 100.5 yesterday; no fever or chills today.  He has a history of frequent cellulitis.   He denies numbness, paresthesias, or weakness in feet.  He denies sore throat, cough, shortness of breath, vomiting, diarrhea, or other symptoms.  Treatment attempted at home with Tylenol.     Allergies  Allergen Reactions  . Sulfa Antibiotics      Past Medical History:  Diagnosis Date  . A-fib ([place])   . Arthritis   . Broken ankle 20 years ago   Right  . Cancer [facility]) 2017   lymphoma  . Chronic kidney disease   . Diabetes mellitus without complication ([place])   . Dysrhythmia   . Gout   . Hyperlipidemia   . Hypertension      Social History   Tobacco Use  . Smoking status: Former Smoker    Packs/day: 1.00    Years: 15.00    Pack years: 15.00    Types: Cigarettes    Quit date: 02/12/2012    Years since quitting: 7.2  . Smokeless tobacco: Never Used  Substance Use Topics  . Alcohol use: Yes   Alcohol/week: 0.0 standard drinks    Comment: beer occ  . Drug use: No   ROS: as stated in HPI.  All other systems reviewed and negative.      Observations/Objective: Physical Exam  VITALS: Patient denies fever. GENERAL: Alert, appears well and in no acute distress. HEENT: Atraumatic. NECK: Normal movements of the head and neck. CARDIOPULMONARY: No increased WOB. Speaking in clear sentences. I:E ratio WNL.  MS: Moves all visible extremities without noticeable abnormality. PSYCH: Pleasant and cooperative, well-groomed. Speech normal rate and rhythm. Affect is appropriate. Insight and judgement are appropriate. Attention is focused, linear, and appropriate.  NEURO: CN grossly intact. Oriented as arrived to appointment on time with no prompting. Moves both UE equally.  SKIN: Left foot appears erythematous with mild edema.     Assessment and Plan:    ICD-10-CM   1. Cellulitis of left lower extremity  L03.116        Follow Up Instructions: Treating with Keflex.  Patient has appointment with PCP already scheduled for next week.  Instructed him to follow up sooner or come here for in-person evaluation if his symptoms are not improving or get worse.  He agrees to plan of care.      I discussed the assessment and treatment plan with the patient. The patient was  provided an opportunity to ask questions and all were answered. The patient agreed with the plan and demonstrated an understanding of the instructions.   The patient was advised to call back or seek an in-person evaluation if the symptoms worsen or if the condition fails to improve as anticipated.      Sharion Balloon, NP  05/13/2019 10:16 AM         Sharion Balloon, NP 05/13/19 [id number]

## 2019-08-31 ENCOUNTER — Other Ambulatory Visit: Payer: Self-pay | Admitting: Orthopedic Surgery

## 2019-09-08 ENCOUNTER — Other Ambulatory Visit: Payer: Self-pay

## 2019-09-08 ENCOUNTER — Encounter
Admission: RE | Admit: 2019-09-08 | Discharge: 2019-09-08 | Disposition: A | Discharge status: Home / Self Care | Payer: Managed Care, Other (non HMO) | Source: Ambulatory Visit | Attending: Orthopedic Surgery | Admitting: Orthopedic Surgery

## 2019-09-08 DIAGNOSIS — Z01818 Encounter for other preprocedural examination: Secondary | ICD-10-CM | POA: Insufficient documentation

## 2019-09-08 LAB — URINALYSIS, ROUTINE W REFLEX MICROSCOPIC
Bacteria, UA: NONE SEEN
Bilirubin Urine: NEGATIVE
Glucose, UA: NEGATIVE mg/dL
Ketones, ur: NEGATIVE mg/dL
Leukocytes,Ua: NEGATIVE
Nitrite: NEGATIVE
Protein, ur: NEGATIVE mg/dL
Specific Gravity, Urine: 1.009 (ref 1.005–1.030)
pH: 5 (ref 5.0–8.0)

## 2019-09-08 LAB — CBC WITH DIFFERENTIAL/PLATELET
Abs Immature Granulocytes: 0.01 10*3/uL (ref 0.00–0.07)
Basophils Absolute: 0 10*3/uL (ref 0.0–0.1)
Basophils Relative: 0 %
Eosinophils Absolute: 0.1 10*3/uL (ref 0.0–0.5)
Eosinophils Relative: 2 %
HCT: 40.7 % (ref 39.0–52.0)
Hemoglobin: 13.9 g/dL (ref 13.0–17.0)
Immature Granulocytes: 0 %
Lymphocytes Relative: 26 %
Lymphs Abs: 1.3 10*3/uL (ref 0.7–4.0)
MCH: 30.2 pg (ref 26.0–34.0)
MCHC: 34.2 g/dL (ref 30.0–36.0)
MCV: 88.3 fL (ref 80.0–100.0)
Monocytes Absolute: 0.5 10*3/uL (ref 0.1–1.0)
Monocytes Relative: 9 %
Neutro Abs: 3 10*3/uL (ref 1.7–7.7)
Neutrophils Relative %: 63 %
Platelets: 226 10*3/uL (ref 150–400)
RBC: 4.61 MIL/uL (ref 4.22–5.81)
RDW: 13.2 % (ref 11.5–15.5)
WBC: 4.9 10*3/uL (ref 4.0–10.5)
nRBC: 0 % (ref 0.0–0.2)

## 2019-09-08 LAB — COMPREHENSIVE METABOLIC PANEL
ALT: 18 U/L (ref 0–44)
AST: 21 U/L (ref 15–41)
Albumin: 3.9 g/dL (ref 3.5–5.0)
Alkaline Phosphatase: 89 U/L (ref 38–126)
Anion gap: 8 (ref 5–15)
BUN: 15 mg/dL (ref 6–20)
CO2: 26 mmol/L (ref 22–32)
Calcium: 9 mg/dL (ref 8.9–10.3)
Chloride: 100 mmol/L (ref 98–111)
Creatinine, Ser: 0.91 mg/dL (ref 0.61–1.24)
GFR calc Af Amer: >60 mL/min (ref >60)
GFR calc non Af Amer: >60 mL/min (ref >60)
Glucose, Bld: 135 mg/dL — ABNORMAL HIGH (ref 70–99)
Potassium: 4.4 mmol/L (ref 3.5–5.1)
Sodium: 134 mmol/L — ABNORMAL LOW (ref 135–145)
Total Bilirubin: 0.7 mg/dL (ref 0.3–1.2)
Total Protein: 7.4 g/dL (ref 6.5–8.1)

## 2019-09-08 LAB — TYPE AND SCREEN
ABO/RH(D): O POS
Antibody Screen: NEGATIVE

## 2019-09-08 LAB — PROTIME-INR
INR: 1.4 — ABNORMAL HIGH (ref 0.8–1.2)
Prothrombin Time: 16.4 seconds — ABNORMAL HIGH (ref 11.4–15.2)

## 2019-09-08 LAB — APTT: aPTT: 37 seconds — ABNORMAL HIGH (ref 24–36)

## 2019-09-08 LAB — SURGICAL PCR SCREEN
MRSA, PCR: NEGATIVE
Staphylococcus aureus: NEGATIVE

## 2019-09-08 NOTE — Patient Instructions (Signed)
Your procedure is scheduled on: Thursday 09/16/19.  Report to DAY SURGERY DEPARTMENT LOCATED ON 2ND FLOOR MEDICAL MALL ENTRANCE. To find out your arrival time please call [phone] between 1PM - 3PM on Wednesday 09/15/19.   Remember: Instructions that are not followed completely may result in serious medical risk, up to and including death, or upon the discretion of your surgeon and anesthesiologist your surgery may need to be rescheduled.     __X__ 1. Do not eat food after midnight the night before your procedure.                 No gum chewing or hard candies. You may drink SUGAR FREE clear liquids up to 2 hours                 before you are scheduled to arrive for your surgery- DO NOT drink clear                 liquids within 2 hours of the start of your surgery.                   __X__2.  On the morning of surgery brush your teeth with toothpaste and water, you may rinse your mouth with mouthwash if you wish.  Do not swallow any toothpaste or mouthwash.     __X__ 3.  No Alcohol for 24 hours before or after surgery.   __X__ 4.  Do Not Smoke or use e-cigarettes For 24 Hours Prior to Your Surgery.                 Do not use any chewable tobacco products for at least 6 hours prior to                 surgery.   __X__5.  Notify your doctor if there is any change in your medical condition      (cold, fever, infections).      Do NOT wear jewelry, make-up, hairpins, clips or nail polish. Do NOT wear lotions, powders, or perfumes.  Do NOT shave 48 hours prior to surgery. Men may shave face and neck. Do NOT bring valuables to the hospital.     [facility] is not responsible for any belongings or valuables.   Contacts, dentures/partials or body piercings may not be worn into surgery. Bring a case for your contacts, glasses or hearing aids, a denture cup will be supplied.   Leave your suitcase in the car. After surgery it may be brought to your room.   For patients admitted to  the hospital, discharge time is determined by your treatment team.    Patients discharged the day of surgery will not be allowed to drive home.     __X__ Take these medicines the morning of surgery with A SIP OF WATER:     1. amLODipine (NORVASC) 5 MG tablet  2. atorvastatin (LIPITOR) 40 MG tablet  3. metoprolol (TOPROL-XL) 200 MG 24 hr tablet      __X__ Use CHG Soap as directed   __X__ Stop Metformin 2 days prior to surgery. Your last dose will be on Monday night 09/13/19.    __X__ Stop Blood Thinners: Xarelto and Aspirin according to Dr. Tonette Bihari instructions.  __X__ Stop Anti-inflammatories 7 days before surgery such as Advil, Ibuprofen, Motrin, BC or Goodies Powder, Naprosyn, Naproxen, Aleve, Aspirin, Meloxicam. May take Tylenol if needed for pain or discomfort.   __X__Do not start taking any new herbal supplements or  vitamins prior to your procedure.     Wear comfortable clothing (specific to your surgery type) to the hospital.  Plan for stool softeners for home use; pain medications have a tendency to cause constipation. You can also help prevent constipation by eating foods high in fiber such as fruits and vegetables and drinking plenty of fluids as your diet allows.  After surgery, you can prevent lung complications by doing breathing exercises.Take deep breaths and cough every 1-2 hours. Your doctor may order a device called an Incentive Spirometer to help you take deep breaths.  Please call the [facility] Department at [phone] if you have any questions about these instructions

## 2019-09-08 NOTE — Progress Notes (Addendum)
  [facility] Perioperative Services: Pre-Admission/Anesthesia Testing  Abnormal Lab Notification    Date: 09/08/19  Name: Colton Choi MRN:   [id number]  Re: Abnormal labs noted during PAT appointment   Provider(s) Notified: Hessie Knows, MD Notification mode: Routed and/or faxed via [place] LAB VALUE(S): Lab Results  Component Value Date   LABPROT 16.4 (H) 09/08/2019   INR 1.4 (H) 09/08/2019   APTT 37 (H) 09/08/2019   Notes: Patient is on daily rivaroxaban. Patient has been instructed that his last dose of rivaroxaban will be on 09/13/2019. Patient is scheduled for a RIGHT total hip arthroplasty on 09/16/2019. This is a Community education officer; no formal response is required.  Honor Loh, MSN, APRN, FNP-C, CEN [facility]  Peri-operative Services Nurse Practitioner Phone: [phone] 09/08/19 3:18 PM

## 2019-09-14 ENCOUNTER — Other Ambulatory Visit: Payer: Self-pay

## 2019-09-14 ENCOUNTER — Other Ambulatory Visit
Admission: RE | Admit: 2019-09-14 | Discharge: 2019-09-14 | Disposition: A | Discharge status: Home / Self Care | Payer: Managed Care, Other (non HMO) | Source: Ambulatory Visit | Attending: Orthopedic Surgery | Admitting: Orthopedic Surgery

## 2019-09-14 DIAGNOSIS — Z01812 Encounter for preprocedural laboratory examination: Secondary | ICD-10-CM | POA: Insufficient documentation

## 2019-09-14 DIAGNOSIS — Z20822 Contact with and (suspected) exposure to covid-19: Secondary | ICD-10-CM | POA: Insufficient documentation

## 2019-09-14 LAB — SARS CORONAVIRUS 2 (TAT 6-24 HRS): SARS Coronavirus 2: NEGATIVE

## 2019-09-15 MED ORDER — DEXTROSE 5 % IV SOLN
3.0000 g | INTRAVENOUS | Status: AC
  Administered 2019-09-16: 3 g via INTRAVENOUS
  Filled 2019-09-15: qty 3

## 2019-09-16 ENCOUNTER — Encounter
Admission: RE | Disposition: A | Discharge status: Home / Self Care | Payer: Self-pay | Source: Ambulatory Visit | Attending: Orthopedic Surgery

## 2019-09-16 ENCOUNTER — Other Ambulatory Visit: Payer: Self-pay

## 2019-09-16 ENCOUNTER — Inpatient Hospital Stay
Admission: RE | Admit: 2019-09-16 | Discharge: 2019-09-18 | DRG: 470 | Disposition: A | Discharge status: Home / Self Care | Payer: Managed Care, Other (non HMO) | Attending: Orthopedic Surgery | Admitting: Orthopedic Surgery

## 2019-09-16 ENCOUNTER — Encounter: Payer: Self-pay | Admitting: Orthopedic Surgery

## 2019-09-16 ENCOUNTER — Observation Stay: Payer: Managed Care, Other (non HMO)

## 2019-09-16 ENCOUNTER — Inpatient Hospital Stay: Payer: Managed Care, Other (non HMO) | Admitting: Anesthesiology

## 2019-09-16 ENCOUNTER — Inpatient Hospital Stay: Payer: Managed Care, Other (non HMO)

## 2019-09-16 DIAGNOSIS — Z7984 Long term (current) use of oral hypoglycemic drugs: Secondary | ICD-10-CM | POA: Diagnosis not present

## 2019-09-16 DIAGNOSIS — E781 Pure hyperglyceridemia: Secondary | ICD-10-CM | POA: Diagnosis present

## 2019-09-16 DIAGNOSIS — Z833 Family history of diabetes mellitus: Secondary | ICD-10-CM | POA: Diagnosis not present

## 2019-09-16 DIAGNOSIS — Z6841 Body Mass Index (BMI) 40.0 and over, adult: Secondary | ICD-10-CM | POA: Diagnosis not present

## 2019-09-16 DIAGNOSIS — M109 Gout, unspecified: Secondary | ICD-10-CM | POA: Diagnosis present

## 2019-09-16 DIAGNOSIS — I4891 Unspecified atrial fibrillation: Secondary | ICD-10-CM | POA: Diagnosis present

## 2019-09-16 DIAGNOSIS — N182 Chronic kidney disease, stage 2 (mild): Secondary | ICD-10-CM | POA: Diagnosis present

## 2019-09-16 DIAGNOSIS — E1122 Type 2 diabetes mellitus with diabetic chronic kidney disease: Secondary | ICD-10-CM | POA: Diagnosis present

## 2019-09-16 DIAGNOSIS — M160 Bilateral primary osteoarthritis of hip: Principal | ICD-10-CM | POA: Diagnosis present

## 2019-09-16 DIAGNOSIS — Z96642 Presence of left artificial hip joint: Secondary | ICD-10-CM | POA: Diagnosis present

## 2019-09-16 DIAGNOSIS — Z7982 Long term (current) use of aspirin: Secondary | ICD-10-CM

## 2019-09-16 DIAGNOSIS — I129 Hypertensive chronic kidney disease with stage 1 through stage 4 chronic kidney disease, or unspecified chronic kidney disease: Secondary | ICD-10-CM | POA: Diagnosis present

## 2019-09-16 DIAGNOSIS — Z7901 Long term (current) use of anticoagulants: Secondary | ICD-10-CM | POA: Diagnosis not present

## 2019-09-16 DIAGNOSIS — G4733 Obstructive sleep apnea (adult) (pediatric): Secondary | ICD-10-CM | POA: Diagnosis present

## 2019-09-16 DIAGNOSIS — E785 Hyperlipidemia, unspecified: Secondary | ICD-10-CM | POA: Diagnosis present

## 2019-09-16 DIAGNOSIS — G8918 Other acute postprocedural pain: Secondary | ICD-10-CM

## 2019-09-16 DIAGNOSIS — M25551 Pain in right hip: Secondary | ICD-10-CM | POA: Diagnosis present

## 2019-09-16 DIAGNOSIS — Z79899 Other long term (current) drug therapy: Secondary | ICD-10-CM

## 2019-09-16 DIAGNOSIS — Z882 Allergy status to sulfonamides status: Secondary | ICD-10-CM | POA: Diagnosis not present

## 2019-09-16 DIAGNOSIS — Z20822 Contact with and (suspected) exposure to covid-19: Secondary | ICD-10-CM | POA: Diagnosis present

## 2019-09-16 DIAGNOSIS — Z419 Encounter for procedure for purposes other than remedying health state, unspecified: Secondary | ICD-10-CM

## 2019-09-16 DIAGNOSIS — Z96649 Presence of unspecified artificial hip joint: Secondary | ICD-10-CM

## 2019-09-16 HISTORY — PX: TOTAL HIP ARTHROPLASTY: SHX124

## 2019-09-16 LAB — GLUCOSE, CAPILLARY
Glucose-Capillary: 102 mg/dL — ABNORMAL HIGH (ref 70–99)
Glucose-Capillary: 223 mg/dL — ABNORMAL HIGH (ref 70–99)
Glucose-Capillary: 234 mg/dL — ABNORMAL HIGH (ref 70–99)
Glucose-Capillary: 206 mg/dL — ABNORMAL HIGH (ref 70–99)
Glucose-Capillary: 241 mg/dL — ABNORMAL HIGH (ref 70–99)
Glucose-Capillary: 198 mg/dL — ABNORMAL HIGH (ref 70–99)

## 2019-09-16 SURGERY — ARTHROPLASTY, HIP, TOTAL, ANTERIOR APPROACH
Anesthesia: General | Site: Hip | Laterality: Right

## 2019-09-16 MED ORDER — RIVAROXABAN 20 MG PO TABS
20.0000 mg | ORAL_TABLET | Freq: Every day | ORAL | Status: DC
  Administered 2019-09-17: 20 mg via ORAL
  Filled 2019-09-16 (×2): qty 1

## 2019-09-16 MED ORDER — SUCCINYLCHOLINE CHLORIDE 20 MG/ML IJ SOLN
INTRAMUSCULAR | Status: DC | PRN
  Administered 2019-09-16: 140 mg via INTRAVENOUS

## 2019-09-16 MED ORDER — PANTOPRAZOLE SODIUM 40 MG PO TBEC
40.0000 mg | DELAYED_RELEASE_TABLET | Freq: Every day | ORAL | Status: DC
  Administered 2019-09-17 – 2019-09-18 (×2): 40 mg via ORAL
  Filled 2019-09-16 (×2): qty 1

## 2019-09-16 MED ORDER — BUPIVACAINE-EPINEPHRINE 0.25% -1:200000 IJ SOLN
INTRAMUSCULAR | Status: DC | PRN
  Administered 2019-09-16: 30 mL

## 2019-09-16 MED ORDER — FENTANYL CITRATE (PF) 100 MCG/2ML IJ SOLN
INTRAMUSCULAR | Status: AC
  Filled 2019-09-16: qty 2

## 2019-09-16 MED ORDER — METHOCARBAMOL 500 MG PO TABS
500.0000 mg | ORAL_TABLET | Freq: Four times a day (QID) | ORAL | Status: DC | PRN
  Administered 2019-09-16 – 2019-09-17 (×2): 500 mg via ORAL
  Filled 2019-09-16 (×2): qty 1

## 2019-09-16 MED ORDER — ZOLPIDEM TARTRATE 5 MG PO TABS
5.0000 mg | ORAL_TABLET | Freq: Every evening | ORAL | Status: DC | PRN
  Administered 2019-09-17: 5 mg via ORAL
  Filled 2019-09-16: qty 1

## 2019-09-16 MED ORDER — OXYCODONE HCL 5 MG PO TABS
10.0000 mg | ORAL_TABLET | ORAL | Status: DC | PRN
  Administered 2019-09-16 (×2): 10 mg via ORAL
  Administered 2019-09-17 – 2019-09-18 (×4): 15 mg via ORAL
  Filled 2019-09-16: qty 3
  Filled 2019-09-16: qty 2
  Filled 2019-09-16 (×2): qty 3
  Filled 2019-09-16: qty 2
  Filled 2019-09-16: qty 3

## 2019-09-16 MED ORDER — CHLORHEXIDINE GLUCONATE 0.12 % MT SOLN: 15.0000 mL | Freq: Once | OROMUCOSAL | Status: AC

## 2019-09-16 MED ORDER — INSULIN ASPART 100 UNIT/ML ~~LOC~~ SOLN
SUBCUTANEOUS | Status: AC
  Administered 2019-09-16: 4 [IU] via SUBCUTANEOUS
  Filled 2019-09-16: qty 1

## 2019-09-16 MED ORDER — SODIUM CHLORIDE FLUSH 0.9 % IV SOLN
INTRAVENOUS | Status: AC
  Filled 2019-09-16: qty 40

## 2019-09-16 MED ORDER — GLIMEPIRIDE 2 MG PO TABS
2.0000 mg | ORAL_TABLET | Freq: Every day | ORAL | Status: DC
  Administered 2019-09-17 – 2019-09-18 (×2): 2 mg via ORAL
  Filled 2019-09-16 (×3): qty 1

## 2019-09-16 MED ORDER — LIDOCAINE HCL (CARDIAC) PF 100 MG/5ML IV SOSY
PREFILLED_SYRINGE | INTRAVENOUS | Status: DC | PRN
  Administered 2019-09-16: 100 mg via INTRAVENOUS

## 2019-09-16 MED ORDER — SODIUM CHLORIDE 0.9 % IV SOLN
INTRAVENOUS | Status: DC | PRN
  Administered 2019-09-16: 25 ug/min via INTRAVENOUS

## 2019-09-16 MED ORDER — SEMAGLUTIDE 7 MG PO TABS: 7.0000 mg | ORAL_TABLET | Freq: Every day | ORAL | Status: DC

## 2019-09-16 MED ORDER — ATORVASTATIN CALCIUM 20 MG PO TABS
40.0000 mg | ORAL_TABLET | Freq: Every day | ORAL | Status: DC
  Administered 2019-09-17 – 2019-09-18 (×2): 40 mg via ORAL
  Filled 2019-09-16 (×2): qty 2

## 2019-09-16 MED ORDER — PROPOFOL 10 MG/ML IV BOLUS
INTRAVENOUS | Status: DC | PRN
  Administered 2019-09-16: 200 mg via INTRAVENOUS

## 2019-09-16 MED ORDER — MIDAZOLAM HCL 2 MG/2ML IJ SOLN
INTRAMUSCULAR | Status: DC | PRN
  Administered 2019-09-16 (×2): 1 mg via INTRAVENOUS

## 2019-09-16 MED ORDER — NEOMYCIN-POLYMYXIN B GU 40-200000 IR SOLN
Status: DC | PRN
  Administered 2019-09-16: 4 mL

## 2019-09-16 MED ORDER — ACETAMINOPHEN 10 MG/ML IV SOLN
INTRAVENOUS | Status: DC | PRN
Start: 2019-09-16 — End: 2019-09-16
  Administered 2019-09-16: 1000 mg via INTRAVENOUS

## 2019-09-16 MED ORDER — KETAMINE HCL 50 MG/ML IJ SOLN
INTRAMUSCULAR | Status: AC
  Filled 2019-09-16: qty 10

## 2019-09-16 MED ORDER — ACETAMINOPHEN 325 MG PO TABS
325.0000 mg | ORAL_TABLET | Freq: Four times a day (QID) | ORAL | Status: DC | PRN
  Administered 2019-09-18: 650 mg via ORAL
  Filled 2019-09-16: qty 2

## 2019-09-16 MED ORDER — MAGNESIUM CITRATE PO SOLN
1.0000 | Freq: Once | ORAL | Status: DC | PRN
  Filled 2019-09-16: qty 296

## 2019-09-16 MED ORDER — VASOPRESSIN 20 UNIT/ML IV SOLN
INTRAVENOUS | Status: DC | PRN
Start: 2019-09-16 — End: 2019-09-16
  Administered 2019-09-16 (×2): 2 [IU] via INTRAVENOUS

## 2019-09-16 MED ORDER — VASOPRESSIN 20 UNIT/ML IV SOLN
INTRAVENOUS | Status: AC
  Filled 2019-09-16: qty 1

## 2019-09-16 MED ORDER — POLYVINYL ALCOHOL 1.4 % OP SOLN
1.0000 [drp] | Freq: Four times a day (QID) | OPHTHALMIC | Status: DC | PRN
  Filled 2019-09-16: qty 15

## 2019-09-16 MED ORDER — GLYCOPYRROLATE 0.2 MG/ML IJ SOLN
INTRAMUSCULAR | Status: AC
  Filled 2019-09-16: qty 3

## 2019-09-16 MED ORDER — ALUM & MAG HYDROXIDE-SIMETH 200-200-20 MG/5ML PO SUSP: 30.0000 mL | ORAL | Status: DC | PRN

## 2019-09-16 MED ORDER — FENTANYL CITRATE (PF) 100 MCG/2ML IJ SOLN
25.0000 ug | INTRAMUSCULAR | Status: DC | PRN
  Administered 2019-09-16 (×4): 25 ug via INTRAVENOUS

## 2019-09-16 MED ORDER — SODIUM CHLORIDE 0.9 % IV SOLN
INTRAVENOUS | Status: DC | PRN
  Administered 2019-09-16: 60 mL

## 2019-09-16 MED ORDER — ORAL CARE MOUTH RINSE: 15.0000 mL | Freq: Once | OROMUCOSAL | Status: AC

## 2019-09-16 MED ORDER — BUPIVACAINE LIPOSOME 1.3 % IJ SUSP
INTRAMUSCULAR | Status: AC
  Filled 2019-09-16: qty 20

## 2019-09-16 MED ORDER — DEXMEDETOMIDINE HCL IN NACL 200 MCG/50ML IV SOLN
INTRAVENOUS | Status: DC | PRN
  Administered 2019-09-16: 12 ug via INTRAVENOUS
  Administered 2019-09-16: 8 ug via INTRAVENOUS

## 2019-09-16 MED ORDER — METHOCARBAMOL 1000 MG/10ML IJ SOLN
500.0000 mg | Freq: Four times a day (QID) | INTRAVENOUS | Status: DC | PRN
  Filled 2019-09-16: qty 5

## 2019-09-16 MED ORDER — FAMOTIDINE 20 MG PO TABS
20.0000 mg | ORAL_TABLET | Freq: Once | ORAL | Status: AC
  Administered 2019-09-17: 20 mg via ORAL
  Filled 2019-09-16: qty 1

## 2019-09-16 MED ORDER — ASPIRIN EC 81 MG PO TBEC
81.0000 mg | DELAYED_RELEASE_TABLET | Freq: Every day | ORAL | Status: DC
  Administered 2019-09-17 – 2019-09-18 (×2): 81 mg via ORAL
  Filled 2019-09-16 (×2): qty 1

## 2019-09-16 MED ORDER — DEXAMETHASONE SODIUM PHOSPHATE 10 MG/ML IJ SOLN
INTRAMUSCULAR | Status: AC
  Filled 2019-09-16: qty 2

## 2019-09-16 MED ORDER — HYDROMORPHONE HCL 1 MG/ML IJ SOLN
INTRAMUSCULAR | Status: DC | PRN
  Administered 2019-09-16: 1 mg via INTRAVENOUS

## 2019-09-16 MED ORDER — SUGAMMADEX SODIUM 500 MG/5ML IV SOLN
INTRAVENOUS | Status: AC
  Filled 2019-09-16: qty 5

## 2019-09-16 MED ORDER — SODIUM CHLORIDE 0.9 % IV SOLN: INTRAVENOUS | Status: DC

## 2019-09-16 MED ORDER — PHENOL 1.4 % MT LIQD
1.0000 | OROMUCOSAL | Status: DC | PRN
  Filled 2019-09-16: qty 177

## 2019-09-16 MED ORDER — TRAMADOL HCL 50 MG PO TABS
50.0000 mg | ORAL_TABLET | Freq: Four times a day (QID) | ORAL | Status: DC
  Administered 2019-09-16 – 2019-09-17 (×5): 50 mg via ORAL
  Filled 2019-09-16 (×5): qty 1

## 2019-09-16 MED ORDER — PHENYLEPHRINE HCL (PRESSORS) 10 MG/ML IV SOLN
INTRAVENOUS | Status: AC
  Filled 2019-09-16: qty 1

## 2019-09-16 MED ORDER — OXYCODONE HCL 5 MG PO TABS
ORAL_TABLET | ORAL | Status: AC
  Filled 2019-09-16: qty 2

## 2019-09-16 MED ORDER — INSULIN ASPART 100 UNIT/ML ~~LOC~~ SOLN: 4.0000 [IU] | Freq: Once | SUBCUTANEOUS | Status: AC

## 2019-09-16 MED ORDER — DOCUSATE SODIUM 100 MG PO CAPS
100.0000 mg | ORAL_CAPSULE | Freq: Two times a day (BID) | ORAL | Status: DC
  Administered 2019-09-16 – 2019-09-18 (×4): 100 mg via ORAL
  Filled 2019-09-16 (×4): qty 1

## 2019-09-16 MED ORDER — NEOMYCIN-POLYMYXIN B GU 40-200000 IR SOLN
Status: AC
  Filled 2019-09-16: qty 20

## 2019-09-16 MED ORDER — INSULIN ASPART 100 UNIT/ML ~~LOC~~ SOLN
0.0000 [IU] | Freq: Three times a day (TID) | SUBCUTANEOUS | Status: DC
  Administered 2019-09-16: 5 [IU] via SUBCUTANEOUS
  Administered 2019-09-17 (×3): 3 [IU] via SUBCUTANEOUS
  Filled 2019-09-16 (×4): qty 1

## 2019-09-16 MED ORDER — LISINOPRIL 20 MG PO TABS
20.0000 mg | ORAL_TABLET | Freq: Every day | ORAL | Status: DC
  Administered 2019-09-17 – 2019-09-18 (×2): 20 mg via ORAL
  Filled 2019-09-16 (×2): qty 1

## 2019-09-16 MED ORDER — CHLORHEXIDINE GLUCONATE 0.12 % MT SOLN
OROMUCOSAL | Status: AC
  Administered 2019-09-16: 15 mL via OROMUCOSAL
  Filled 2019-09-16: qty 15

## 2019-09-16 MED ORDER — EPHEDRINE 5 MG/ML INJ
INTRAVENOUS | Status: AC
  Filled 2019-09-16: qty 10

## 2019-09-16 MED ORDER — METOCLOPRAMIDE HCL 5 MG/ML IJ SOLN: 5.0000 mg | Freq: Three times a day (TID) | INTRAMUSCULAR | Status: DC | PRN

## 2019-09-16 MED ORDER — METOCLOPRAMIDE HCL 10 MG PO TABS: 5.0000 mg | ORAL_TABLET | Freq: Three times a day (TID) | ORAL | Status: DC | PRN

## 2019-09-16 MED ORDER — SUGAMMADEX SODIUM 500 MG/5ML IV SOLN
INTRAVENOUS | Status: DC | PRN
  Administered 2019-09-16: 500 mg via INTRAVENOUS

## 2019-09-16 MED ORDER — DEXTROSE 5 % IV SOLN
3.0000 g | Freq: Four times a day (QID) | INTRAVENOUS | Status: AC
  Administered 2019-09-16 (×2): 3 g via INTRAVENOUS
  Filled 2019-09-16: qty 3
  Filled 2019-09-16 (×2): qty 3000

## 2019-09-16 MED ORDER — FENTANYL CITRATE (PF) 100 MCG/2ML IJ SOLN
INTRAMUSCULAR | Status: DC | PRN
  Administered 2019-09-16 (×4): 50 ug via INTRAVENOUS

## 2019-09-16 MED ORDER — OXYCODONE HCL 5 MG PO TABS
5.0000 mg | ORAL_TABLET | ORAL | Status: DC | PRN
  Administered 2019-09-16: 10 mg via ORAL

## 2019-09-16 MED ORDER — FENTANYL CITRATE (PF) 100 MCG/2ML IJ SOLN
INTRAMUSCULAR | Status: AC
  Administered 2019-09-16: 25 ug via INTRAVENOUS
  Filled 2019-09-16: qty 2

## 2019-09-16 MED ORDER — EPHEDRINE SULFATE 50 MG/ML IJ SOLN
INTRAMUSCULAR | Status: DC | PRN
  Administered 2019-09-16: 5 mg via INTRAVENOUS
  Administered 2019-09-16: 10 mg via INTRAVENOUS
  Administered 2019-09-16: 5 mg via INTRAVENOUS
  Administered 2019-09-16: 10 mg via INTRAVENOUS

## 2019-09-16 MED ORDER — ONDANSETRON HCL 4 MG/2ML IJ SOLN
INTRAMUSCULAR | Status: DC | PRN
  Administered 2019-09-16 (×2): 4 mg via INTRAVENOUS

## 2019-09-16 MED ORDER — ROCURONIUM BROMIDE 100 MG/10ML IV SOLN
INTRAVENOUS | Status: DC | PRN
  Administered 2019-09-16: 20 mg via INTRAVENOUS
  Administered 2019-09-16: 70 mg via INTRAVENOUS

## 2019-09-16 MED ORDER — ONDANSETRON HCL 4 MG/2ML IJ SOLN
INTRAMUSCULAR | Status: AC
  Filled 2019-09-16: qty 6

## 2019-09-16 MED ORDER — FAMOTIDINE 20 MG PO TABS
ORAL_TABLET | ORAL | Status: AC
  Filled 2019-09-16: qty 1

## 2019-09-16 MED ORDER — MAGNESIUM HYDROXIDE 400 MG/5ML PO SUSP
30.0000 mL | Freq: Every day | ORAL | Status: DC | PRN
  Administered 2019-09-18: 30 mL via ORAL
  Filled 2019-09-16: qty 30

## 2019-09-16 MED ORDER — PHENYLEPHRINE HCL (PRESSORS) 10 MG/ML IV SOLN
INTRAVENOUS | Status: DC | PRN
  Administered 2019-09-16 (×3): 100 ug via INTRAVENOUS

## 2019-09-16 MED ORDER — ONDANSETRON HCL 4 MG/2ML IJ SOLN: 4.0000 mg | Freq: Once | INTRAMUSCULAR | Status: DC | PRN

## 2019-09-16 MED ORDER — AMLODIPINE BESYLATE 5 MG PO TABS
5.0000 mg | ORAL_TABLET | Freq: Every day | ORAL | Status: DC
  Administered 2019-09-17 – 2019-09-18 (×2): 5 mg via ORAL
  Filled 2019-09-16 (×2): qty 1

## 2019-09-16 MED ORDER — DIPHENHYDRAMINE HCL 12.5 MG/5ML PO ELIX: 12.5000 mg | ORAL_SOLUTION | ORAL | Status: DC | PRN

## 2019-09-16 MED ORDER — MIDAZOLAM HCL 2 MG/2ML IJ SOLN
INTRAMUSCULAR | Status: AC
  Filled 2019-09-16: qty 2

## 2019-09-16 MED ORDER — METOPROLOL SUCCINATE ER 50 MG PO TB24
200.0000 mg | ORAL_TABLET | ORAL | Status: DC
  Administered 2019-09-17 – 2019-09-18 (×2): 200 mg via ORAL
  Filled 2019-09-16 (×2): qty 4

## 2019-09-16 MED ORDER — DEXMEDETOMIDINE HCL IN NACL 80 MCG/20ML IV SOLN
INTRAVENOUS | Status: AC
  Filled 2019-09-16: qty 20

## 2019-09-16 MED ORDER — BISACODYL 10 MG RE SUPP: 10.0000 mg | Freq: Every day | RECTAL | Status: DC | PRN

## 2019-09-16 MED ORDER — ONDANSETRON HCL 4 MG/2ML IJ SOLN: 4.0000 mg | Freq: Four times a day (QID) | INTRAMUSCULAR | Status: DC | PRN

## 2019-09-16 MED ORDER — BUPIVACAINE-EPINEPHRINE (PF) 0.25% -1:200000 IJ SOLN
INTRAMUSCULAR | Status: AC
  Filled 2019-09-16: qty 30

## 2019-09-16 MED ORDER — HYDROCHLOROTHIAZIDE 12.5 MG PO CAPS
12.5000 mg | ORAL_CAPSULE | Freq: Every day | ORAL | Status: DC
  Administered 2019-09-17: 12.5 mg via ORAL
  Filled 2019-09-16 (×2): qty 1

## 2019-09-16 MED ORDER — METFORMIN HCL ER 500 MG PO TB24
500.0000 mg | ORAL_TABLET | Freq: Every day | ORAL | Status: DC
  Administered 2019-09-17 – 2019-09-18 (×2): 500 mg via ORAL
  Filled 2019-09-16 (×2): qty 1

## 2019-09-16 MED ORDER — ONDANSETRON HCL 4 MG PO TABS: 4.0000 mg | ORAL_TABLET | Freq: Four times a day (QID) | ORAL | Status: DC | PRN

## 2019-09-16 MED ORDER — KETAMINE HCL 50 MG/ML IJ SOLN
INTRAMUSCULAR | Status: DC | PRN
Start: 2019-09-16 — End: 2019-09-16
  Administered 2019-09-16: 15 mg via INTRAMUSCULAR
  Administered 2019-09-16: 10 mg via INTRAMUSCULAR

## 2019-09-16 MED ORDER — DEXTROSE 5 % IV SOLN
3.0000 g | Freq: Four times a day (QID) | INTRAVENOUS | Status: DC
  Filled 2019-09-16 (×3): qty 3000

## 2019-09-16 MED ORDER — LIDOCAINE HCL (PF) 2 % IJ SOLN
INTRAMUSCULAR | Status: AC
  Filled 2019-09-16: qty 10

## 2019-09-16 MED ORDER — GLYCOPYRROLATE 0.2 MG/ML IJ SOLN
INTRAMUSCULAR | Status: DC | PRN
  Administered 2019-09-16: .2 mg via INTRAVENOUS

## 2019-09-16 MED ORDER — PROPOFOL 10 MG/ML IV BOLUS
INTRAVENOUS | Status: AC
  Filled 2019-09-16: qty 20

## 2019-09-16 MED ORDER — ACETAMINOPHEN 500 MG PO TABS
1000.0000 mg | ORAL_TABLET | Freq: Four times a day (QID) | ORAL | Status: AC
  Administered 2019-09-16 – 2019-09-17 (×3): 1000 mg via ORAL
  Filled 2019-09-16 (×3): qty 2

## 2019-09-16 MED ORDER — ACETAMINOPHEN 10 MG/ML IV SOLN
INTRAVENOUS | Status: AC
  Filled 2019-09-16: qty 100

## 2019-09-16 MED ORDER — LISINOPRIL-HYDROCHLOROTHIAZIDE 20-12.5 MG PO TABS: 1.0000 | ORAL_TABLET | Freq: Every day | ORAL | Status: DC

## 2019-09-16 MED ORDER — DEXAMETHASONE SODIUM PHOSPHATE 10 MG/ML IJ SOLN
INTRAMUSCULAR | Status: DC | PRN
  Administered 2019-09-16: 10 mg via INTRAVENOUS

## 2019-09-16 MED ORDER — HYDROMORPHONE HCL 1 MG/ML IJ SOLN: 0.5000 mg | INTRAMUSCULAR | Status: DC | PRN

## 2019-09-16 MED ORDER — MENTHOL 3 MG MT LOZG
1.0000 | LOZENGE | OROMUCOSAL | Status: DC | PRN
  Filled 2019-09-16: qty 9

## 2019-09-16 MED ORDER — HYDROMORPHONE HCL 1 MG/ML IJ SOLN
INTRAMUSCULAR | Status: AC
  Filled 2019-09-16: qty 1

## 2019-09-16 SURGICAL SUPPLY — 63 items
BLADE SAGITTAL AGGR TOOTH XLG (BLADE) ×3 IMPLANT
BNDG COHESIVE 6X5 TAN STRL LF (GAUZE/BANDAGES/DRESSINGS) ×9 IMPLANT
CANISTER SUCT 1200ML W/VALVE (MISCELLANEOUS) ×3 IMPLANT
CANISTER WOUND CARE 500ML ATS (WOUND CARE) ×3 IMPLANT
CHLORAPREP W/TINT 26 (MISCELLANEOUS) ×6 IMPLANT
COVER BACK TABLE REUSABLE LG (DRAPES) ×3 IMPLANT
COVER WAND RF STERILE (DRAPES) ×3 IMPLANT
CUP ACETAB VERSA DBL 28X58 DMI (Orthopedic Implant) ×3 IMPLANT
DRAPE 3/4 80X56 (DRAPES) ×9 IMPLANT
DRAPE C-ARM XRAY 36X54 (DRAPES) ×3 IMPLANT
DRAPE INCISE IOBAN 66X60 STRL (DRAPES) IMPLANT
DRAPE POUCH INSTRU U-SHP 10X18 (DRAPES) ×3 IMPLANT
DRESSING SURGICEL FIBRLLR 1X2 (HEMOSTASIS) ×2 IMPLANT
DRSG OPSITE POSTOP 4X8 (GAUZE/BANDAGES/DRESSINGS) IMPLANT
DRSG SURGICEL FIBRILLAR 1X2 (HEMOSTASIS) ×6
ELECT BLADE 6.5 EXT (BLADE) ×3 IMPLANT
ELECT REM PT RETURN 9FT ADLT (ELECTROSURGICAL) ×3
ELECTRODE REM PT RTRN 9FT ADLT (ELECTROSURGICAL) ×1 IMPLANT
GLOVE BIOGEL PI IND STRL 9 (GLOVE) ×1 IMPLANT
GLOVE BIOGEL PI INDICATOR 9 (GLOVE) ×2
GLOVE SURG SYN 9.0  PF PI (GLOVE) ×4
GLOVE SURG SYN 9.0 PF PI (GLOVE) ×2 IMPLANT
GOWN SRG 2XL LVL 4 RGLN SLV (GOWNS) ×1 IMPLANT
GOWN STRL NON-REIN 2XL LVL4 (GOWNS) ×2
GOWN STRL REUS W/ TWL LRG LVL3 (GOWN DISPOSABLE) ×1 IMPLANT
GOWN STRL REUS W/TWL LRG LVL3 (GOWN DISPOSABLE) ×2
HANDLE YANKAUER SUCT BULB TIP (MISCELLANEOUS) ×3 IMPLANT
HEAD FEMORAL SZ XXL 28MM (Head) ×3 IMPLANT
HEMOVAC 400CC 10FR (MISCELLANEOUS) IMPLANT
HOLDER FOLEY CATH W/STRAP (MISCELLANEOUS) IMPLANT
HOOD PEEL AWAY FLYTE STAYCOOL (MISCELLANEOUS) ×3 IMPLANT
KIT PREVENA INCISION MGT 13 (CANNISTER) ×3 IMPLANT
MASTERLOC HIP LATERAL S7 (Hips) ×3 IMPLANT
MAT ABSORB  FLUID 56X50 GRAY (MISCELLANEOUS) ×2
MAT ABSORB FLUID 56X50 GRAY (MISCELLANEOUS) ×1 IMPLANT
NDL SAFETY ECLIPSE 18X1.5 (NEEDLE) ×1 IMPLANT
NEEDLE HYPO 18GX1.5 SHARP (NEEDLE) ×2
NEEDLE SPNL 20GX3.5 QUINCKE YW (NEEDLE) ×6 IMPLANT
NS IRRIG 1000ML POUR BTL (IV SOLUTION) ×3 IMPLANT
PACK HIP COMPR (MISCELLANEOUS) ×3 IMPLANT
SCALPEL PROTECTED #10 DISP (BLADE) ×6 IMPLANT
SEALER BIPOLAR AQUA 6.0 (INSTRUMENTS) ×3 IMPLANT
SHELL ACETABULAR SZ0 58MM (Shell) ×3 IMPLANT
SOL PREP PVP 2OZ (MISCELLANEOUS) ×3
SOLUTION PREP PVP 2OZ (MISCELLANEOUS) ×1 IMPLANT
SPONGE DRAIN TRACH 4X4 STRL 2S (GAUZE/BANDAGES/DRESSINGS) IMPLANT
STAPLER SKIN PROX 35W (STAPLE) ×3 IMPLANT
STRAP SAFETY 5IN WIDE (MISCELLANEOUS) ×3 IMPLANT
SUT DVC 2 QUILL PDO  T11 36X36 (SUTURE) ×2
SUT DVC 2 QUILL PDO T11 36X36 (SUTURE) ×1 IMPLANT
SUT SILK 0 (SUTURE) ×2
SUT SILK 0 30XBRD TIE 6 (SUTURE) ×1 IMPLANT
SUT V-LOC 90 ABS DVC 3-0 CL (SUTURE) ×3 IMPLANT
SUT VIC AB 1 CT1 36 (SUTURE) ×3 IMPLANT
SUT VIC AB 2-0 SH 27 (SUTURE) ×2
SUT VIC AB 2-0 SH 27XBRD (SUTURE) ×1 IMPLANT
SYR 20ML LL LF (SYRINGE) ×3 IMPLANT
SYR 30ML LL (SYRINGE) ×3 IMPLANT
SYR 50ML LL SCALE MARK (SYRINGE) ×6 IMPLANT
SYR BULB IRRIG 60ML STRL (SYRINGE) ×3 IMPLANT
TAPE MICROFOAM 4IN (TAPE) ×3 IMPLANT
TOWEL OR 17X26 4PK STRL BLUE (TOWEL DISPOSABLE) ×3 IMPLANT
TRAY FOLEY MTR SLVR 16FR STAT (SET/KITS/TRAYS/PACK) IMPLANT

## 2019-09-16 NOTE — Anesthesia Preprocedure Evaluation (Addendum)
Anesthesia Evaluation  Patient identified by MRN, date of birth, ID band Patient awake    Reviewed: Allergy & Precautions, NPO status , Patient's Chart, lab work & pertinent test results, reviewed documented beta blocker date and time   Airway Mallampati: III       Dental  (+) Upper Dentures, Lower Dentures   Pulmonary former smoker,    Pulmonary exam normal        Cardiovascular hypertension, Pt. on medications and Pt. on home beta blockers Normal cardiovascular exam+ dysrhythmias Atrial Fibrillation      Neuro/Psych negative neurological ROS  negative psych ROS   GI/Hepatic negative GI ROS, Neg liver ROS,   Endo/Other  diabetesMorbid obesity  Renal/GU Renal InsufficiencyRenal disease  negative genitourinary   Musculoskeletal  (+) Arthritis , Osteoarthritis,    Abdominal (+) + obese,   Peds negative pediatric ROS (+)  Hematology   Anesthesia Other Findings Past Medical History: No date: A-fib ([place]) No date: Arthritis 20 years ago: Broken ankle     Comment:  Right 2017: Cancer ([place])     Comment:  lymphoma No date: Chronic kidney disease No date: Diabetes mellitus without complication ([facility]) No date: Dysrhythmia No date: Gout No date: Hyperlipidemia No date: Hypertension  Reproductive/Obstetrics                            Anesthesia Physical Anesthesia Plan  ASA: III  Anesthesia Plan: General   Post-op Pain Management:    Induction: Intravenous  PONV Risk Score and Plan:   Airway Management Planned: Oral ETT  Additional Equipment:   Intra-op Plan:   Post-operative Plan: Extubation in OR  Informed Consent: I have reviewed the patients History and Physical, chart, labs and discussed the procedure including the risks, benefits and alternatives for the proposed anesthesia with the patient or authorized representative who has indicated his/her understanding and acceptance.      Dental advisory given  Plan Discussed with: CRNA and Surgeon  Anesthesia Plan Comments:         Anesthesia Quick Evaluation

## 2019-09-16 NOTE — Op Note (Signed)
09/16/2019  9:43 AM  PATIENT:  Colton Choi  54 y.o. male  PRE-OPERATIVE DIAGNOSIS:  Primary osteoarthritis of right hip M16.11  POST-OPERATIVE DIAGNOSIS:  Primary osteoarthritis of right hip M16.11  PROCEDURE:  Procedure(s): TOTAL HIP ARTHROPLASTY ANTERIOR APPROACH (Right)  SURGEON: Laurene Footman, MD  ASSISTANTS: None  ANESTHESIA:   general  EBL:  Total I/O In: 1000 [I.V.:1000] Out: 350 [Blood:350]  BLOOD ADMINISTERED:none  DRAINS: Incisional wound VAC   LOCAL MEDICATIONS USED:  MARCAINE    and OTHER Exparel  SPECIMEN:  Source of Specimen:  Right femoral head  DISPOSITION OF SPECIMEN:  PATHOLOGY  COUNTS:  YES  TOURNIQUET:  * No tourniquets in log *  IMPLANTS: Medacta Masterloc 7 lateralized, 58 mm Mpact DM cup and liner with X XL metal 28 mm head  DICTATION: .Dragon Dictation   The patient was brought to the operating room and after general anesthesia was obtained patient was placed on the operative table with the ipsilateral foot into the Medacta attachment, contralateral leg on a well-padded table. C-arm was brought in and preop template x-ray taken. After prepping and draping in usual sterile fashion appropriate patient identification and timeout procedures were completed. Anterior approach to the hip was obtained and centered over the greater trochanter and TFL muscle. The subcutaneous tissue was incised hemostasis being achieved by electrocautery. TFL fascia was incised and the muscle retracted laterally deep retractor placed.  Because of the patient's large size aqua Manis was used during the procedure to aid in hemostasis . The anterior capsule was exposed and a capsulotomy performed. The neck was identified and a femoral neck cut carried out with a saw. The head was removed without difficulty and showed sclerotic femoral head and acetabulum. Reaming was carried out to 58 mm and a 58 mm cup trial gave appropriate tightness to the acetabular component a 58 DM cup was  impacted into position. The leg was then externally rotated and ischiofemoral and pubofemoral releases carried out. The femur was sequentially broached to a size 7, size 7 lateralized with L and then XXL trials were placed and the final components chosen. The 7 lateralized stem was inserted along with a XXL metal 28 mm head and 58 mm liner. The hip was reduced and was stable the wound was thoroughly irrigated with fibrillar placed along the posterior capsule and medial neck. The deep fascia ws closed using a heavy Quill after infiltration of 30 cc of quarter percent Sensorcaine with epinephrine mixed with Exparel throughout the case .3-0 V-loc to close the skin with skin staples.  Incisional wound VAC applied and patient was sent to recovery in stable condition.   PLAN OF CARE: Admit for overnight observation

## 2019-09-16 NOTE — Anesthesia Procedure Notes (Signed)
Procedure Name: Intubation Performed by: Kelton Pillar, CRNA Pre-anesthesia Checklist: Patient identified, Emergency Drugs available, Suction available and Patient being monitored Patient Re-evaluated:Patient Re-evaluated prior to induction Oxygen Delivery Method: Circle system utilized Preoxygenation: Pre-oxygenation with 100% oxygen Induction Type: IV induction Ventilation: Mask ventilation without difficulty Laryngoscope Size: McGraph and 4 Grade View: Grade I Tube type: Oral Tube size: 7.5 mm Number of attempts: 1 Airway Equipment and Method: Stylet and Oral airway Placement Confirmation: ETT inserted through vocal cords under direct vision,  positive ETCO2,  breath sounds checked- equal and bilateral and CO2 detector Secured at: 22 cm Tube secured with: Tape Dental Injury: Teeth and Oropharynx as per pre-operative assessment

## 2019-09-16 NOTE — OR Nursing (Signed)
Medtronic Aquamantys 1A setting at 130.

## 2019-09-16 NOTE — Evaluation (Signed)
Physical Therapy Evaluation Patient Details Name: Colton Choi MRN: [id number] DOB: Aug 14, 1965 Today's Date: 09/16/2019   History of Present Illness  Pt is a 54 yo male s/p R THA, anterior approach. PMH of smoker, afib, renal disease, DM, obesity, lymphoma, L THA.  Clinical Impression  Pt alert, oriented, agreeable to PT, able to move LEs freely, reported 3/10 pain in R hip. The patient stated at baseline he is independent, uses [facility], works fulltime, drives, lives alone. Sister is in town to assist after surgery until pt is able to return to PLOF. No falls to report.  The patient demonstrated bed level exercises with verbal and tactile cues, able to move RLE without assistance. Supine to sit with bed rails and supervision. Good sitting balance noted. Pt taught back weight bearing restrictions (75%), and exhibited great adherence during several steps to recliner with step to gait pattern. Pt up in chair, all needs in reach.  Overall the patient demonstrated deficits (see "PT Problem List") that impede the patient's functional abilities, safety, and mobility and would benefit from skilled PT intervention. Recommendation is HHPT with intermittent supervision/assistance.      Follow Up Recommendations Home health PT;Supervision - Intermittent    Equipment Recommendations  None recommended by PT;Other (comment) (pt has bariatric RW at home)    Recommendations for Other Services       Precautions / Restrictions Precautions Precautions: Anterior Hip Precaution Booklet Issued: Yes (comment) Restrictions Weight Bearing Restrictions: Yes RLE Weight Bearing: Partial weight bearing RLE Partial Weight Bearing Percentage or Pounds: 75%      Mobility  Bed Mobility Overal bed mobility: Needs Assistance Bed Mobility: Supine to Sit     Supine to sit: Supervision;HOB elevated        Transfers Overall transfer level: Needs assistance Equipment used: Rolling walker (2 wheeled) Transfers: Sit  to/from Stand Sit to Stand: Min guard;From elevated surface            Ambulation/Gait Ambulation/Gait assistance: Min guard Gait Distance (Feet): 2 Feet Assistive device: Rolling walker (2 wheeled) (bariatric)       General Gait Details: cued for 50% weight bearing initially, and then increased to 75% with pt comfort. Instructed in step to gait pattern  Stairs            Wheelchair Mobility    Modified Rankin (Stroke Patients Only)       Balance Overall balance assessment: Needs assistance Sitting-balance support: Feet supported Sitting balance-Leahy Scale: Good       Standing balance-Leahy Scale: Fair Standing balance comment: reliant on UE for any mobility                             Pertinent Vitals/Pain Pain Assessment: No/denies pain    Home Living Family/patient expects to be discharged to:: Private residence Living Arrangements: Alone Available Help at Discharge: Family;Available 24 hours/day (sister) Type of Home: House Home Access: Stairs to enter Entrance Stairs-Rails: None Entrance Stairs-Number of Steps: 2 Home Layout: One level Home Equipment: Walker - 2 wheels;Cane - single point;Crutches      Prior Function Level of Independence: Independent with assistive device(s)         Comments: FT as truck driver, modI with SPC, 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 after previous surgery     Hand Dominance   Dominant Hand: Left    Extremity/Trunk Assessment   Upper Extremity Assessment Upper Extremity Assessment: Overall WFL for tasks assessed    Lower  Extremity Assessment Lower Extremity Assessment: RLE deficits/detail;LLE deficits/detail RLE Deficits / Details: able to lift against gravity without assistance LLE Deficits / Details: WFLs       Communication   Communication: No difficulties  Cognition Arousal/Alertness: Awake/alert Behavior During Therapy: WFL for tasks assessed/performed Overall Cognitive Status: Within Functional Limits for  tasks assessed                                        General Comments      Exercises Total Joint Exercises Ankle Circles/Pumps: AROM;Both;10 reps Quad Sets: AROM;Both;10 reps Gluteal Sets: AROM;Both;10 reps Heel Slides: AROM;Strengthening;Right;10 reps Hip ABduction/ADduction: AROM;Strengthening;Right;10 reps   Assessment/Plan    PT Assessment Patient needs continued PT services  PT Problem List Decreased strength;Decreased mobility;Decreased range of motion;Decreased knowledge of precautions;Decreased activity tolerance;Decreased balance;Pain;Decreased knowledge of use of DME       PT Treatment Interventions DME instruction;Therapeutic exercise;Gait training;Balance training;Stair training;Neuromuscular re-education;Functional mobility training;Therapeutic activities;Patient/family education    PT Goals (Current goals can be found in the Care Plan section)  Acute Rehab PT Goals Patient Stated Goal: to go home PT Goal Formulation: With patient Time For Goal Achievement: 09/30/19 Potential to Achieve Goals: Good    Frequency BID   Barriers to discharge        Co-evaluation               AM-PAC PT "6 Clicks" Mobility  Outcome Measure Help needed turning from your back to your side while in a flat bed without using bedrails?: A Little Help needed moving from lying on your back to sitting on the side of a flat bed without using bedrails?: A Little Help needed moving to and from a bed to a chair (including a wheelchair)?: A Little Help needed standing up from a chair using your arms (e.g., wheelchair or bedside chair)?: A Little Help needed to walk in hospital room?: A Little Help needed climbing 3-5 steps with a railing? : A Little 6 Click Score: 18    End of Session Equipment Utilized During Treatment: Gait belt Activity Tolerance: Patient tolerated treatment well Patient left: in chair;with call bell/phone within reach;with SCD's  reapplied Nurse Communication: Mobility status PT Visit Diagnosis: Other abnormalities of gait and mobility (R26.89);Muscle weakness (generalized) (M62.81);Difficulty in walking, not elsewhere classified (R26.2);Pain Pain - Right/Left: Right Pain - part of body: Hip    Time: 1440-1509 PT Time Calculation (min) (ACUTE ONLY): 29 min   Charges:   PT Evaluation $PT Eval Low Complexity: 1 Low PT Treatments $Therapeutic Exercise: 8-22 mins        Lieutenant Diego PT, DPT 3:40 PM,09/16/19

## 2019-09-16 NOTE — Plan of Care (Signed)

## 2019-09-16 NOTE — Transfer of Care (Signed)
Immediate Anesthesia Transfer of Care Note  Patient: Colton Choi  Procedure(s) Performed: TOTAL HIP ARTHROPLASTY ANTERIOR APPROACH (Right Hip)  Patient Location: PACU  Anesthesia Type:General  Level of Consciousness: awake, drowsy and patient cooperative  Airway & Oxygen Therapy: Patient Spontanous Breathing and Patient connected to face mask oxygen  Post-op Assessment: Report given to RN and Post -op Vital signs reviewed and stable  Post vital signs: Reviewed and stable  Last Vitals:  Vitals Value Taken Time  BP 155/85 09/16/19 0952  Temp    Pulse 59 09/16/19 0954  Resp 10 09/16/19 0954  SpO2 96 % 09/16/19 0954  Vitals shown include unvalidated device data.  Last Pain:  Vitals:   09/16/19 0952  PainSc: (P) 6       Patients Stated Pain Goal: 3 (73/22/56 7209)  Complications: No complications documented.

## 2019-09-16 NOTE — H&P (Signed)
Chief Complaint  Patient presents with  . Follow-up  Rt hip pain   History of the Present Illness: Colton Choi is a 54 y.o. male here for evaluation of right hip osteoarthritis. The patient had a prior left total hip for severe osteoarthritis. The right hip osteoarthritis was present when he had his prior left total hip arthroplasty, but it was not as nearly as bothersome as the left hip. X-rays were taken today.  The patient has had 3 stem cell injections to the right hip starting in 11/2018, which have not provided relief of pain. He states he has been able to use his left leg more for the past couple of months. He states heat makes his right hip pain worse. He used a cane when at home, but not when he is working.   The patient has diabetes. His hemoglobin A1c is around 8, and was previously under 7. He is taking medication for this. His next appointment to have his A1c checked in in 10/2019.   The patient is employed as a Administrator.   I have reviewed past medical, surgical, social and family history, and allergies as documented in the EMR.  Past Medical History: Past Medical History:  Diagnosis Date  . Diabetes mellitus type 2, uncomplicated (CMS-HCC)  . Diabetes mellitus with stage 2 chronic kidney disease (CMS-HCC)  . History of cancer 2017  . Hyperglycemia  . Hypertension  . Hypertriglyceridemia  . OSA on CPAP  . Osteoarthritis of hip   Past Surgical History: Past Surgical History:  Procedure Laterality Date  . JOINT REPLACEMENT Left 09/2018  hip   Past Family History: Family History  Problem Relation Age of Onset  . Breast cancer Mother  . Diabetes type II Sister  . Diabetes type II Other  grandmother  . Colon cancer Maternal Grandfather   Medications: Current Outpatient Medications Ordered in Epic  Medication Sig Dispense Refill  . amLODIPine (NORVASC) 5 MG tablet Take 1 tablet by mouth once daily 90 tablet 1  . aspirin 81 MG EC tablet Take 81 mg by mouth  once daily.  [name] atorvastatin (LIPITOR) 40 MG tablet Take 1 tablet (40 mg total) by mouth once daily 90 tablet 11  . glimepiride (AMARYL) 2 MG tablet Take 1 tablet (2 mg total) by mouth daily with breakfast 90 tablet 1  . lisinopriL-hydrochlorothiazide (ZESTORETIC) 20-12.5 mg tablet Take 1 tablet by mouth once daily 90 tablet 1  . metFORMIN (GLUCOPHAGE-XR) 500 MG XR tablet Take 3 tablets (1,500 mg total) by mouth daily with dinner 90 tablet 5  . metoprolol succinate (TOPROL-XL) 200 MG XL tablet Take 1 tablet (200 mg total) by mouth once daily 90 tablet 1  . rivaroxaban (XARELTO) 20 mg tablet Take 1 tablet (20 mg total) by mouth once daily 30 tablet 11  . semaglutide 7 mg Tab Take 7 mg by mouth once daily Do not cut, crush, or chew 90 tablet 3   No current Epic-ordered facility-administered medications on file.   Allergies: Allergies  Allergen Reactions  . Sulfa (Sulfonamide Antibiotics) Unknown    Body mass index is 51.25 kg/m.  Review of Systems: A comprehensive 14 point ROS was performed, reviewed, and the pertinent orthopaedic findings are documented in the HPI.  There were no vitals filed for this visit.  General Physical Examination:  General/Constitutional: No apparent distress: well-nourished and well developed. Eyes: Pupils equal, round with synchronous movement. Lungs: Clear to auscultation HEENT: Normal Vascular: No edema, swelling or tenderness, except  as noted in detailed exam. Cardiac: Heart rate and rhythm is regular. Integumentary: No impressive skin lesions present, except as noted in detailed exam. Neuro/Psych: Normal mood and affect, oriented to person, place and time.  Musculoskeletal Examination: On exam, right hip stiffness. He has -10 degrees of internal rotation of the right hip and 30 degrees of external rotation. Prior left hip arthroscopy incision looks well healed. Antalgic gait.  Lungs are clear. Heart rate and rhythm is normal. HEENT is remarkable  for full upper and lower dentures. He has some edema in the lower extremities with some venous stasis changes to his ankles and feet. Neurovascularly intact.  Radiographs: AP pelvis and lateral x-rays of the right hip were ordered and personally reviewed today. 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  X-ray Impression Severe osteoarthritis; incidental finding of left total hip with good position, bony ingrowth, no evidence of HO or loosening.  Assessment: ICD-10-CM  1. Primary osteoarthritis of right hip M16.11  2. Status post total hip replacement, left Z96.642  3. Primary localized osteoarthritis of left hip M16.12   Plan: The patient has clinical findings of severe right hip osteoarthritis requiring total hip arthroplasty.  We discussed the patient 's x-ray findings and compared them with his prior x-rays. He would like to proceed with right total hip arthroplasty in 09/2019.   Surgical Risks:  The nature of the condition and the proposed procedure has been reviewed in detail with the patient. Surgical versus non-surgical options and prognosis for recovery have been reviewed and the inherent risks and benefits of each have been discussed including the risks of infection, bleeding, injury to nerves/blood vessels/tendons, incomplete relief of symptoms, persisting pain and/or stiffness, loss of function, complex regional pain syndrome, failure of the procedure, as appropriate.  Teeth: Full upper and lower dentures   Scribe Attestation: I, Dawn Royse, am acting as scribe for [employer], MD    Electronically signed by Lauris Poag, MD at 08/18/2019 7:43 PM EDT  Back to top of Progress Notes Ephriam Jenkins, [place] - 08/18/2019 8:15 AM EDT Formatting of this note might be  different from the original. Review of Systems  Constitutional: Negative.  HENT: Negative.  Eyes: Negative.  Respiratory: Negative.  Cardiovascular: Negative.  Gastrointestinal: Negative.  Endocrine: Negative.  Genitourinary: Negative.  Musculoskeletal: Positive for arthralgias and gait problem.  Skin: Negative.  Allergic/Immunologic: Negative.  Psychiatric/Behavioral: Negative.    Electronically signed by Ephriam Jenkins, [place] at 08/18/2019 7:43 PM EDT   Reviewed paper H+P, will be scanned into chart. No changes noted.

## 2019-09-17 LAB — BASIC METABOLIC PANEL
Anion gap: 9 (ref 5–15)
BUN: 11 mg/dL (ref 6–20)
CO2: 24 mmol/L (ref 22–32)
Calcium: 7.8 mg/dL — ABNORMAL LOW (ref 8.9–10.3)
Chloride: 101 mmol/L (ref 98–111)
Creatinine, Ser: 0.81 mg/dL (ref 0.61–1.24)
GFR calc Af Amer: >60 mL/min (ref >60)
GFR calc non Af Amer: >60 mL/min (ref >60)
Glucose, Bld: 214 mg/dL — ABNORMAL HIGH (ref 70–99)
Potassium: 4.2 mmol/L (ref 3.5–5.1)
Sodium: 134 mmol/L — ABNORMAL LOW (ref 135–145)

## 2019-09-17 LAB — GLUCOSE, CAPILLARY
Glucose-Capillary: 181 mg/dL — ABNORMAL HIGH (ref 70–99)
Glucose-Capillary: 169 mg/dL — ABNORMAL HIGH (ref 70–99)
Glucose-Capillary: 160 mg/dL — ABNORMAL HIGH (ref 70–99)
Glucose-Capillary: 112 mg/dL — ABNORMAL HIGH (ref 70–99)

## 2019-09-17 LAB — CBC
HCT: 32.9 % — ABNORMAL LOW (ref 39.0–52.0)
Hemoglobin: 11.3 g/dL — ABNORMAL LOW (ref 13.0–17.0)
MCH: 29.9 pg (ref 26.0–34.0)
MCHC: 34.3 g/dL (ref 30.0–36.0)
MCV: 87 fL (ref 80.0–100.0)
Platelets: 199 10*3/uL (ref 150–400)
RBC: 3.78 MIL/uL — ABNORMAL LOW (ref 4.22–5.81)
RDW: 13.6 % (ref 11.5–15.5)
WBC: 7 10*3/uL (ref 4.0–10.5)
nRBC: 0 % (ref 0.0–0.2)

## 2019-09-17 MED ORDER — OXYCODONE HCL 5 MG PO TABS: 5.0000 mg | ORAL_TABLET | ORAL | 0 refills | Status: AC | PRN

## 2019-09-17 MED ORDER — TRAMADOL HCL 50 MG PO TABS: 50.0000 mg | ORAL_TABLET | Freq: Four times a day (QID) | ORAL | 0 refills | Status: AC

## 2019-09-17 NOTE — Progress Notes (Signed)
Physical Therapy Treatment Patient Details Name: Colton Choi MRN: [id number] DOB: 25-Jun-1965 Today's Date: 09/17/2019    History of Present Illness Pt is a 54 yo male s/p R THA, anterior approach. PMH of smoker, afib, renal disease, DM, obesity, lymphoma, L THA.    PT Comments    Pt alert in chair, agreeable to PT. At end of session reported 6/10 R hip pain. Session focused on stair training to ensure safe discharge; pt able to perform navigation with bilaterally axillary crutches, verbalized and demonstrated understanding. Pt also ambulated ~83ft with RW and adherence to PWB (75%). Pt up in chair, all needs in reach, family at  Bedside. The patient would benefit from further skilled PT intervention to continue to progress towards goals. Recommendation remains appropriate.      Follow Up Recommendations  Home health PT;Supervision - Intermittent     Equipment Recommendations  None recommended by PT;Other (comment) (has bariatric RW At home)    Recommendations for Other Services       Precautions / Restrictions Precautions Precautions: Anterior Hip Precaution Booklet Issued: No Restrictions Weight Bearing Restrictions: Yes RLE Weight Bearing: Partial weight bearing RLE Partial Weight Bearing Percentage or Pounds: 75% Other Position/Activity Restrictions: Per Dr. Rudene Christians, pt able to perform stair training WBAT.    Mobility  Bed Mobility               General bed mobility comments: pt up in recliner at start of session  Transfers Overall transfer level: Needs assistance Equipment used: Rolling walker (2 wheeled) Transfers: Sit to/from Stand Sit to Stand: Supervision         General transfer comment: heavy reliance on UEs but overall steady  Ambulation/Gait Ambulation/Gait assistance: Min guard Gait Distance (Feet): 60 Feet Assistive device: Rolling walker (2 wheeled) Gait Pattern/deviations: Step-to pattern         Stairs Stairs: Yes Stairs assistance: Min  guard;+2 safety/equipment Stair Management: No rails;With crutches;Step to pattern   General stair comments: Pt able to navigate one step forwards, and descend backwards. Also able to ascend stair backwards, and descend forwards, so pt has attempted normal stair navigation via one step. pt and famly verbalized understanding   Wheelchair Mobility    Modified Rankin (Stroke Patients Only)       Balance Overall balance assessment: Needs assistance Sitting-balance support: Feet supported Sitting balance-Leahy Scale: Good       Standing balance-Leahy Scale: Fair Standing balance comment: able to stand statically without at least unilateral support                            Cognition Arousal/Alertness: Awake/alert Behavior During Therapy: WFL for tasks assessed/performed Overall Cognitive Status: Within Functional Limits for tasks assessed                                        Exercises      General Comments        Pertinent Vitals/Pain Pain Assessment: 0-10 Pain Score: 6  Pain Location: R hip/quad Pain Descriptors / Indicators: Aching;Sore Pain Intervention(s): Limited activity within patient's tolerance;Monitored during session;Repositioned    Home Living                      Prior Function            PT Goals (current goals can  now be found in the care plan section) Progress towards PT goals: Progressing toward goals    Frequency    BID      PT Plan Current plan remains appropriate    Co-evaluation              AM-PAC PT "6 Clicks" Mobility   Outcome Measure  Help needed turning from your back to your side while in a flat bed without using bedrails?: A Little Help needed moving from lying on your back to sitting on the side of a flat bed without using bedrails?: A Little Help needed moving to and from a bed to a chair (including a wheelchair)?: A Little Help needed standing up from a chair using your arms  (e.g., wheelchair or bedside chair)?: A Little Help needed to walk in hospital room?: A Little Help needed climbing 3-5 steps with a railing? : A Little 6 Click Score: 18    End of Session Equipment Utilized During Treatment: Gait belt Activity Tolerance: Patient tolerated treatment well Patient left: in chair;with call bell/phone within reach;with SCD's reapplied Nurse Communication: Mobility status PT Visit Diagnosis: Other abnormalities of gait and mobility (R26.89);Muscle weakness (generalized) (M62.81);Difficulty in walking, not elsewhere classified (R26.2);Pain Pain - Right/Left: Right Pain - part of body: Hip     Time: 8309-4076 PT Time Calculation (min) (ACUTE ONLY): 29 min  Charges:  $Gait Training: 8-22 mins $Therapeutic Exercise: 8-22 mins                    Lieutenant Diego PT, DPT 4:04 PM,09/17/19

## 2019-09-17 NOTE — Progress Notes (Signed)
  Subjective: 1 Day Post-Op Procedure(s) (LRB): TOTAL HIP ARTHROPLASTY ANTERIOR APPROACH (Right) Patient reports pain as moderate.   Patient is well, and has had no acute complaints or problems Plan is to go Home after hospital stay. Negative for chest pain and shortness of breath Fever: no Gastrointestinal: Negative for nausea and vomiting  Objective: Vital signs in last 24 hours: Temp:  [97.2 F (36.2 C)-98.4 F (36.9 C)] 97.9 F (36.6 C) (08/06 0401) Pulse Rate:  [58-82] 82 (08/06 0401) Resp:  [11-21] 16 (08/06 0401) BP: (112-157)/(50-93) 131/64 (08/06 0401) SpO2:  [90 %-100 %] 99 % (08/06 0401)  Intake/Output from previous day:  Intake/Output Summary (Last 24 hours) at 09/17/2019 0659 Last data filed at 09/16/2019 1800 Gross per 24 hour  Intake 2560 ml  Output 1150 ml  Net 1410 ml    Intake/Output this shift: No intake/output data recorded.  Labs: No results for input(s): HGB in the last 72 hours. No results for input(s): WBC, RBC, HCT, PLT in the last 72 hours. No results for input(s): NA, K, CL, CO2, BUN, CREATININE, GLUCOSE, CALCIUM in the last 72 hours. No results for input(s): LABPT, INR in the last 72 hours.   EXAM General - Patient is Alert and Oriented Extremity - Neurovascular intact Sensation intact distally Dorsiflexion/Plantar flexion intact Compartment soft Dressing/Incision - clean, dry, with the wound VAC intact and draining Motor Function - intact, moving foot and toes well on exam.   Past Medical History:  Diagnosis Date  . A-fib ([place])   . Arthritis   . Broken ankle 20 years ago   Right  . Cancer [facility]) 2017   lymphoma  . Chronic kidney disease   . Diabetes mellitus without complication ([place])   . Dysrhythmia   . Gout   . Hyperlipidemia   . Hypertension     Assessment/Plan: 1 Day Post-Op Procedure(s) (LRB): TOTAL HIP ARTHROPLASTY ANTERIOR APPROACH (Right) Active Problems:   S/P hip replacement  Estimated body mass index is 51.23  kg/m as calculated from the following:   Height as of 09/08/19: 6\' 2"  (1.88 m).   Weight as of 09/08/19: 181 kg. Advance diet Up with therapy D/C IV fluids Discharge home with home health on Sunday with possible on Saturday  DVT Prophylaxis - Xarelto, Foot Pumps and TED hose Weight-Bearing as tolerated to right leg  Reche Dixon, PA-C Orthopaedic Surgery 09/17/2019, 6:59 AM

## 2019-09-17 NOTE — Evaluation (Signed)
Occupational Therapy Evaluation Patient Details Name: Colton Choi MRN: [id number] DOB: 1965/12/26 Today's Date: 09/17/2019    History of Present Illness Pt is a 54 yo male s/p R THA, anterior approach. PMH of smoker, afib, renal disease, DM, obesity, lymphoma, L THA.   Clinical Impression   Pt seen for OT evaluation this date, POD#1 from above surgery. Pt was independent in all ADL prior to surgery, however occasionally using a SPC for mobility due to R hip pain. Pt is eager to return to PLOF with less pain and improved safety and independence. Pt currently requires PRN minimal assist for LB dressing and bathing while in seated position due to pain and limited AROM of R hip. Pt instructed in AE/DME, home/routines, falls prevention, pet care considerations, compression stocking mgt. Handout provided. Pt verbalized understanding and denies additional needs as he had his other hip done last year and able to recall previous instruction. Do not anticipate need for additional skilled OT services at this time. Will sign off.     Follow Up Recommendations  No OT follow up;Supervision - Intermittent    Equipment Recommendations  None recommended by OT    Recommendations for Other Services       Precautions / Restrictions Precautions Precautions: Anterior Hip Precaution Booklet Issued: Yes (comment) Restrictions Weight Bearing Restrictions: Yes RLE Weight Bearing: Partial weight bearing RLE Partial Weight Bearing Percentage or Pounds: 75%      Mobility Bed Mobility Overal bed mobility: Needs Assistance Bed Mobility: Supine to Sit     Supine to sit: Supervision;HOB elevated     General bed mobility comments: deferred, pt preferred to let pain meds kick in and waiting to get OOB with PT  Transfers Overall transfer level: Needs assistance Equipment used: Rolling walker (2 wheeled) Transfers: Sit to/from Stand Sit to Stand: From elevated surface;Supervision         General  transfer comment: deferred, pt preferred to let pain meds kick in and waiting to get OOB with PT    Balance Overall balance assessment: Needs assistance Sitting-balance support: Feet supported Sitting balance-Leahy Scale: Good       Standing balance-Leahy Scale: Fair Standing balance comment: reliant on UE for any mobility                           ADL either performed or assessed with clinical judgement   ADL Overall ADL's : Needs assistance/impaired                                       General ADL Comments: Pt requires assist for compression stocking mgt (has assist available); CGA for ADL transfers and mobility with RW; Min A for LB ADL without AE, Mod I for LB ADL with AE     Vision Baseline Vision/History: Wears glasses Wears Glasses: At all times Patient Visual Report: No change from baseline       Perception     Praxis      Pertinent Vitals/Pain Pain Assessment: 0-10 Pain Score: 3  Pain Location: R hip/quad Pain Descriptors / Indicators: Aching;Sore Pain Intervention(s): Limited activity within patient's tolerance;Monitored during session;Premedicated before session     Hand Dominance Left   Extremity/Trunk Assessment Upper Extremity Assessment Upper Extremity Assessment: Overall WFL for tasks assessed   Lower Extremity Assessment Lower Extremity Assessment: RLE deficits/detail RLE Deficits / Details: expected post-op  strength/ROM deficits       Communication Communication Communication: No difficulties   Cognition Arousal/Alertness: Awake/alert Behavior During Therapy: WFL for tasks assessed/performed Overall Cognitive Status: Within Functional Limits for tasks assessed                                     General Comments       Exercises Total Joint Exercises Ankle Circles/Pumps: AROM;Both;10 reps Short Arc Quad: AROM;Strengthening;Right;10 reps Heel Slides: AROM;Strengthening;Right;10 reps Other  Exercises Other Exercises: Pt instructed in AE/DME, home/routines, falls prevention, pet care considerations, compression stocking mgt. Handout provided. Pt verbalized understanding and denies additional needs as he had his other hip done last year and able to recall previous instruction   Shoulder Instructions      Home Living Family/patient expects to be discharged to:: Private residence Living Arrangements: Alone Available Help at Discharge: Family;Available 24 hours/day (sister) Type of Home: House Home Access: Stairs to enter [employer] of Steps: 2 Entrance Stairs-Rails: None Home Layout: One level     Bathroom Shower/Tub: Teacher, early years/pre: Handicapped height     Home Equipment: Environmental consultant - 2 wheels;Cane - single point;Crutches;Toilet riser;Bedside commode;Adaptive equipment Adaptive Equipment: Reacher;Sock aid;Long-handled shoe horn;Long-handled sponge        Prior Functioning/Environment Level of Independence: Independent with assistive device(s)        Comments: FT as truck driver, modI with SPC, used RW after previous surgery        OT Problem List: Decreased strength;Decreased range of motion;Pain      OT Treatment/Interventions:      OT Goals(Current goals can be found in the care plan section) Acute Rehab OT Goals Patient Stated Goal: go home and recover OT Goal Formulation: All assessment and education complete, DC therapy  OT Frequency:     Barriers to D/C:            Co-evaluation              AM-PAC OT "6 Clicks" Daily Activity     Outcome Measure Help from another person eating meals?: None Help from another person taking care of personal grooming?: None Help from another person toileting, which includes using toliet, bedpan, or urinal?: A Little Help from another person bathing (including washing, rinsing, drying)?: A Little Help from another person to put on and taking off regular upper body clothing?:  None Help from another person to put on and taking off regular lower body clothing?: A Little 6 Click Score: 21   End of Session    Activity Tolerance: Patient tolerated treatment well Patient left: in bed;with call bell/phone within reach;with bed alarm set;with SCD's reapplied  OT Visit Diagnosis: Other abnormalities of gait and mobility (R26.89);Pain Pain - Right/Left: Right Pain - part of body: Hip                Time: 0915-0930 OT Time Calculation (min): 15 min Charges:  OT General Charges $OT Visit: 1 Visit OT Evaluation $OT Eval Low Complexity: 1 Low OT Treatments $Self Care/Home Management : 8-22 mins  Jeni Salles, MPH, MS, OTR/L ascom [phone] 09/17/19, 10:51 AM

## 2019-09-17 NOTE — Progress Notes (Signed)
Physical Therapy Treatment Patient Details Name: Colton Choi MRN: [id number] DOB: 08/26/1965 Today's Date: 09/17/2019    History of Present Illness Pt is a 54 yo male s/p R THA, anterior approach. PMH of smoker, afib, renal disease, DM, obesity, lymphoma, L THA.    PT Comments    Pt alert, reported 3/10 pain in R hip. Pt able to perform supine and seated exercises with verbal cueing. Supine to sit with HOB elevated and use of bed rails, supervision. Sit <> stand with RW and supervision, pt reported less pain than yesterday with mobility. He ambulated ~18ft with RW and CGA/supervision. Excellent step to gait pattern noted to adhere to weight bearing precautions. Pt up in chair, all needs in reach at end of session. The patient would benefit from further skilled PT intervention to continue to progress towards goals. Recommendation remains appropriate.     Follow Up Recommendations  Home health PT;Supervision - Intermittent     Equipment Recommendations  None recommended by PT;Other (comment) (pt has bariatric walker at home)    Recommendations for Other Services       Precautions / Restrictions Precautions Precautions: Anterior Hip Precaution Booklet Issued: Yes (comment) Restrictions Weight Bearing Restrictions: Yes RLE Weight Bearing: Partial weight bearing RLE Partial Weight Bearing Percentage or Pounds: 75%    Mobility  Bed Mobility Overal bed mobility: Needs Assistance Bed Mobility: Supine to Sit     Supine to sit: Supervision;HOB elevated        Transfers Overall transfer level: Needs assistance Equipment used: Rolling walker (2 wheeled) Transfers: Sit to/from Stand Sit to Stand: From elevated surface;Supervision            Ambulation/Gait Ambulation/Gait assistance: Min guard Gait Distance (Feet): 100 Feet Assistive device: Rolling walker (2 wheeled) (bariatric) Gait Pattern/deviations: Step-to pattern     General Gait Details: step to gait pattern,  pt steady/ safe.   Stairs             Wheelchair Mobility    Modified Rankin (Stroke Patients Only)       Balance Overall balance assessment: Needs assistance Sitting-balance support: Feet supported Sitting balance-Leahy Scale: Good       Standing balance-Leahy Scale: Fair Standing balance comment: reliant on UE for any mobility                            Cognition Arousal/Alertness: Awake/alert Behavior During Therapy: WFL for tasks assessed/performed Overall Cognitive Status: Within Functional Limits for tasks assessed                                        Exercises Total Joint Exercises Ankle Circles/Pumps: AROM;Both;10 reps Short Arc Quad: AROM;Strengthening;Right;10 reps Heel Slides: AROM;Strengthening;Right;10 reps    General Comments        Pertinent Vitals/Pain Pain Assessment: 0-10 Pain Score: 3  Pain Location: R hip/quad Pain Descriptors / Indicators: Aching;Sore Pain Intervention(s): Limited activity within patient's tolerance;Monitored during session;Repositioned;Premedicated before session;Ice applied    Home Living                      Prior Function            PT Goals (current goals can now be found in the care plan section) Progress towards PT goals: Progressing toward goals    Frequency    BID  PT Plan Current plan remains appropriate    Co-evaluation              AM-PAC PT "6 Clicks" Mobility   Outcome Measure  Help needed turning from your back to your side while in a flat bed without using bedrails?: A Little Help needed moving from lying on your back to sitting on the side of a flat bed without using bedrails?: A Little Help needed moving to and from a bed to a chair (including a wheelchair)?: A Little Help needed standing up from a chair using your arms (e.g., wheelchair or bedside chair)?: A Little Help needed to walk in hospital room?: A Little Help needed climbing  3-5 steps with a railing? : A Little 6 Click Score: 18    End of Session Equipment Utilized During Treatment: Gait belt Activity Tolerance: Patient tolerated treatment well Patient left: in chair;with call bell/phone within reach;with SCD's reapplied Nurse Communication: Mobility status PT Visit Diagnosis: Other abnormalities of gait and mobility (R26.89);Muscle weakness (generalized) (M62.81);Difficulty in walking, not elsewhere classified (R26.2);Pain Pain - Right/Left: Right Pain - part of body: Hip     Time: 6184-8592 PT Time Calculation (min) (ACUTE ONLY): 28 min  Charges:  $Therapeutic Exercise: 23-37 mins                     Lieutenant Diego PT, DPT 10:18 AM,09/17/19

## 2019-09-17 NOTE — Discharge Instructions (Signed)
ANTERIOR APPROACH TOTAL HIP REPLACEMENT POSTOPERATIVE DIRECTIONS   Hip Rehabilitation, Guidelines Following Surgery  The results of a hip operation are greatly improved after range of motion and muscle strengthening exercises. Follow all safety measures which are given to protect your hip. If any of these exercises cause increased pain or swelling in your joint, decrease the amount until you are comfortable again. Then slowly increase the exercises. Call your caregiver if you have problems or questions.   HOME CARE INSTRUCTIONS  Remove items at home which could result in a fall. This includes throw rugs or furniture in walking pathways.   ICE to the affected hip every three hours for 30 minutes at a time and then as needed for pain and swelling.  Continue to use ice on the hip for pain and swelling from surgery. You may notice swelling that will progress down to the foot and ankle.  This is normal after surgery.  Elevate the leg when you are not up walking on it.    Continue to use the breathing machine which will help keep your temperature down.  It is common for your temperature to cycle up and down following surgery, especially at night when you are not up moving around and exerting yourself.  The breathing machine keeps your lungs expanded and your temperature down.  Do not place pillow under knee, focus on keeping the knee straight while resting  DIET You may resume your previous home diet once your are discharged from the hospital.  DRESSING / WOUND CARE / SHOWERING The wound VAC will remain in place for 5 to 7 days after discharge.  When the canister begins to alarm, the battery is weakening.  It can be changed by physical therapy to a new dry dressing.  No showering after the dressing is changed. Keep your dressing dry with showering.  You can keep it covered and pat dry. Change the surgical dressing daily and reapply a dry dressing each time.  ACTIVITY Walk with your walker as  instructed. Use walker as long as suggested by your caregivers. Avoid periods of inactivity such as sitting longer than an hour when not asleep. This helps prevent blood clots.  You may resume a sexual relationship in one month or when given the OK by your doctor.  You may return to work once you are cleared by your doctor.  Do not drive a car for 6 weeks or until released by you surgeon.  Do not drive while taking narcotics.  WEIGHT BEARING Weight bearing as tolerated with assist device (walker, cane, etc) as directed, use it as long as suggested by your surgeon or therapist, typically at least 4-6 weeks.  POSTOPERATIVE CONSTIPATION PROTOCOL Constipation - defined medically as fewer than three stools per week and severe constipation as less than one stool per week.  One of the most common issues patients have following surgery is constipation.  Even if you have a regular bowel pattern at home, your normal regimen is likely to be disrupted due to multiple reasons following surgery.  Combination of anesthesia, postoperative narcotics, change in appetite and fluid intake all can affect your bowels.  In order to avoid complications following surgery, here are some recommendations in order to help you during your recovery period.  Colace (docusate) - Pick up an over-the-counter form of Colace or another stool softener and take twice a day as long as you are requiring postoperative pain medications.  Take with a full glass of water daily.  If  you experience loose stools or diarrhea, hold the colace until you stool forms back up.  If your symptoms do not get better within 1 week or if they get worse, check with your doctor.  Dulcolax (bisacodyl) - Pick up over-the-counter and take as directed by the product packaging as needed to assist with the movement of your bowels.  Take with a full glass of water.  Use this product as needed if not relieved by Colace only.   MiraLax (polyethylene glycol) - Pick  up over-the-counter to have on hand.  MiraLax is a solution that will increase the amount of water in your bowels to assist with bowel movements.  Take as directed and can mix with a glass of water, juice, soda, coffee, or tea.  Take if you go more than two days without a movement. Do not use MiraLax more than once per day. Call your doctor if you are still constipated or irregular after using this medication for 7 days in a row.  If you continue to have problems with postoperative constipation, please contact the office for further assistance and recommendations.  If you experience "the worst abdominal pain ever" or develop nausea or vomiting, please contact the office immediatly for further recommendations for treatment.  ITCHING  If you experience itching with your medications, try taking only a single pain pill, or even half a pain pill at a time.  You can also use Benadryl over the counter for itching or also to help with sleep.   TED HOSE STOCKINGS Wear the elastic stockings on both legs for three weeks following surgery during the day but you may remove then at night for sleeping.  MEDICATIONS See your medication summary on the "After Visit Summary" that the nursing staff will review with you prior to discharge.  You may have some home medications which will be placed on hold until you complete the course of blood thinner medication.  It is important for you to complete the blood thinner medication as prescribed by your surgeon.  Continue your approved medications as instructed at time of discharge.  PRECAUTIONS If you experience chest pain or shortness of breath - call 911 immediately for transfer to the hospital emergency department.  If you develop a fever greater that 101 F, purulent drainage from wound, increased redness or drainage from wound, foul odor from the wound/dressing, or calf pain - CONTACT YOUR SURGEON.                                                   FOLLOW-UP  APPOINTMENTS Make sure you keep all of your appointments after your operation with your surgeon and caregivers. You should call the office at the above phone number and make an appointment for approximately two weeks after the date of your surgery or on the date instructed by your surgeon outlined in the "After Visit Summary".  RANGE OF MOTION AND STRENGTHENING EXERCISES  These exercises are designed to help you keep full movement of your hip joint. Follow your caregiver's or physical therapist's instructions. Perform all exercises about fifteen times, three times per day or as directed. Exercise both hips, even if you have had only one joint replacement. These exercises can be done on a training (exercise) mat, on the floor, on a table or on a bed. Use whatever works the best and  is most comfortable for you. Use music or television while you are exercising so that the exercises are a pleasant break in your day. This will make your life better with the exercises acting as a break in routine you can look forward to.  Lying on your back, slowly slide your foot toward your buttocks, raising your knee up off the floor. Then slowly slide your foot back down until your leg is straight again.  Lying on your back spread your legs as far apart as you can without causing discomfort.  Lying on your side, raise your upper leg and foot straight up from the floor as far as is comfortable. Slowly lower the leg and repeat.  Lying on your back, tighten up the muscle in the front of your thigh (quadriceps muscles). You can do this by keeping your leg straight and trying to raise your heel off the floor. This helps strengthen the largest muscle supporting your knee.  Lying on your back, tighten up the muscles of your buttocks both with the legs straight and with the knee bent at a comfortable angle while keeping your heel on the floor.   IF YOU ARE TRANSFERRED TO A SKILLED REHAB FACILITY If the patient is transferred to a  skilled rehab facility following release from the hospital, a list of the current medications will be sent to the facility for the patient to continue.  When discharged from the skilled rehab facility, please have the facility set up the patient's [place] prior to being released. Also, the skilled facility will be responsible for providing the patient with their medications at time of release from the facility to include their pain medication, the muscle relaxants, and their blood thinner medication. If the patient is still at the rehab facility at time of the two week follow up appointment, the skilled rehab facility will also need to assist the patient in arranging follow up appointment in our office and any transportation needs.  MAKE SURE YOU:  Understand these instructions.  Get help right away if you are not doing well or get worse.    Pick up stool softner and laxative for home use following surgery while on pain medications. Do not submerge incision under water. Please use good hand washing techniques while changing dressing each day. May shower starting three days after surgery. Please use a clean towel to pat the incision dry following showers. Continue to use ice for pain and swelling after surgery. Do not use any lotions or creams on the incision until instructed by your surgeon.

## 2019-09-17 NOTE — Progress Notes (Signed)
OT Cancellation Note  Patient Details Name: Colton Choi MRN: [id number] DOB: 14-Apr-1965   Cancelled Treatment:    Reason Eval/Treat Not Completed: Other (comment). Consult received, chart reviewed. Upon attempt, pt with RN who requested OT come back later after pt had opportunity to take pain medication and self-care.   Jeni Salles, MPH, MS, OTR/L ascom [phone] 09/17/19, 8:40 AM

## 2019-09-17 NOTE — TOC Initial Note (Signed)
Transition of Care [facility]) - Initial/Assessment Note    Patient Details  Name: Colton Choi MRN: [id number] Date of Birth: 1965-02-27  [facility]) CM/SW Contact:    Elease Hashimoto, LCSW Phone Number: 09/17/2019, 1:56 PM  Clinical Narrative:  Met with pt to discuss discharge needs. Made aware with his Christella Scheuermann he will need to do OPPT since no home health will not take his insurance. He has needed equipment from previous surgeries. His sister is here from out of town to stay with him for assist. Then once doing better will return home. Pt having pain issues with this hip was prior to admission. Aware will probably go home over the weekend.                  Expected Discharge Plan: OP Rehab Barriers to Discharge: No Barriers Identified   Patient Goals and CMS Choice Patient states their goals for this hospitalization and ongoing recovery are:: My sister will be helping me until I am better and can be home alone      Expected Discharge Plan and Services Expected Discharge Plan: OP Rehab In-house Referral: Clinical Social Work     Living arrangements for the past 2 months: Single Family Home                                      Prior Living Arrangements/Services Living arrangements for the past 2 months: Single Family Home Lives with:: Self Patient language and need for interpreter reviewed:: No Do you feel safe going back to the place where you live?: Yes      Need for Family Participation in Patient Care: No (Comment) Care giver support system in place?: Yes (comment) (sister short time to assist) Current home services: DME (bariatric rw) Criminal Activity/Legal Involvement Pertinent to Current Situation/Hospitalization: No - Comment as needed  Activities of Daily Living Home Assistive Devices/Equipment: CBG Meter, Dentures (specify type), Eyeglasses, Walker (specify type), Bedside commode/3-in-1 ADL Screening (condition at time of admission) Patient's  cognitive ability adequate to safely complete daily activities?: Yes Is the patient deaf or have difficulty hearing?: No Does the patient have difficulty seeing, even when wearing glasses/contacts?: No Does the patient have difficulty concentrating, remembering, or making decisions?: No Patient able to express need for assistance with ADLs?: Yes Does the patient have difficulty dressing or bathing?: No Independently performs ADLs?: Yes (appropriate for developmental age) Does the patient have difficulty walking or climbing stairs?: Yes Weakness of Legs: Right Weakness of Arms/Hands: None  Permission Sought/Granted Permission sought to share information with : Family Supports Permission granted to share information with : Yes, Verbal Permission Granted  Share Information with NAME: Terri     Permission granted to share info w Relationship: sister     Emotional Assessment Appearance:: Appears stated age Attitude/Demeanor/Rapport: Engaged Affect (typically observed): Adaptable, Accepting Orientation: : Oriented to Self, Oriented to Place, Oriented to  Time, Oriented to Situation   Psych Involvement: No (comment)  Admission diagnosis:  S/P hip replacement [Z96.649] Patient Active Problem List   Diagnosis Date Noted   S/P hip replacement 09/16/2019   Status post total hip replacement, left 09/22/2018   Swelling of left lower extremity 10/10/2014   Hip pain 10/10/2014   Hodgkin lymphoma, 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

## 2019-09-18 LAB — CBC
HCT: 30.7 % — ABNORMAL LOW (ref 39.0–52.0)
Hemoglobin: 10.6 g/dL — ABNORMAL LOW (ref 13.0–17.0)
MCH: 30.8 pg (ref 26.0–34.0)
MCHC: 34.5 g/dL (ref 30.0–36.0)
MCV: 89.2 fL (ref 80.0–100.0)
Platelets: 172 10*3/uL (ref 150–400)
RBC: 3.44 MIL/uL — ABNORMAL LOW (ref 4.22–5.81)
RDW: 14 % (ref 11.5–15.5)
WBC: 7.3 10*3/uL (ref 4.0–10.5)
nRBC: 0 % (ref 0.0–0.2)

## 2019-09-18 LAB — GLUCOSE, CAPILLARY: Glucose-Capillary: 104 mg/dL — ABNORMAL HIGH (ref 70–99)

## 2019-09-18 MED ORDER — FLEET ENEMA 7-19 GM/118ML RE ENEM: 1.0000 | ENEMA | Freq: Once | RECTAL | Status: DC

## 2019-09-18 NOTE — TOC Transition Note (Signed)
Transition of Care [facility]) - CM/SW Discharge Note   Patient Details  Name: Colton Choi MRN: [id number] Date of Birth: June 01, 1965  Transition of Care [facility]) CM/SW Contact:  Boris Sharper, LCSW Phone Number: 09/18/2019, 12:54 PM   Clinical Narrative:    Pt medically stable for discharge per MD. Pt will be transported home by his sister. CSW contacted PA about pt's OP rehab and he stated "I will place a STAT order for him when I return to the office on Monday and arrange for him to come into our clinic for Outpatient PT."  Final next level of care: OP Rehab Barriers to Discharge: No Barriers Identified   Patient Goals and CMS Choice Patient states their goals for this hospitalization and ongoing recovery are:: My sister will be helping me until I am better and can be home alone      Discharge Placement                Patient to be transferred to facility by: Sister Name of family member notified: Terri Patient and family notified of of transfer: 09/18/19  Discharge Plan and Services In-house Referral: Clinical Social Work                                   Social Determinants of Health (SDOH) Interventions     Readmission Risk Interventions No flowsheet data found.

## 2019-09-18 NOTE — Progress Notes (Signed)
  Subjective: 2 Days Post-Op Procedure(s) (LRB): TOTAL HIP ARTHROPLASTY ANTERIOR APPROACH (Right) Patient reports pain as 7 on 0-10 scale.   Patient is well, and has had no acute complaints or problems Plan is to go Home after hospital stay. Patient has cleared all needed steps with PT. Plan for d/c home today after a BM. Negative for chest pain and shortness of breath Fever: no Gastrointestinal: Negative for nausea and vomiting  Objective: Vital signs in last 24 hours: Temp:  [97.9 F (36.6 C)-98.6 F (37 C)] 98.6 F (37 C) (08/07 0819) Pulse Rate:  [66-72] 72 (08/07 0819) Resp:  [18] 18 (08/07 0819) BP: (106-123)/(42-66) 123/51 (08/07 0819) SpO2:  [95 %-100 %] 97 % (08/07 0819) Weight:  [180.9 kg] 180.9 kg (08/07 0806)  Intake/Output from previous day:  Intake/Output Summary (Last 24 hours) at 09/18/2019 0957 Last data filed at 09/18/2019 0701 Gross per 24 hour  Intake 240 ml  Output 1300 ml  Net -1060 ml    Intake/Output this shift: Total I/O In: -  Out: 700 [Urine:700]  Labs: Recent Labs    09/17/19 0653 09/18/19 0437  HGB 11.3* 10.6*   Recent Labs    09/17/19 0653 09/18/19 0437  WBC 7.0 7.3  RBC 3.78* 3.44*  HCT 32.9* 30.7*  PLT 199 172   Recent Labs    09/17/19 0653  NA 134*  K 4.2  CL 101  CO2 24  BUN 11  CREATININE 0.81  GLUCOSE 214*  CALCIUM 7.8*   No results for input(s): LABPT, INR in the last 72 hours.   EXAM General - Patient is Alert and Oriented Extremity - Neurovascular intact Sensation intact distally Dorsiflexion/Plantar flexion intact No cellulitis present Compartment soft Dressing/Incision - clean, dry, with the wound VAC intact and draining mild bloody discharge. Motor Function - intact, moving foot and toes well on exam.  Negative Homans to bilateral lower extremities.  Past Medical History:  Diagnosis Date  . A-fib ([place])   . Arthritis   . Broken ankle 20 years ago   Right  . Cancer [facility]) 2017   lymphoma  .  Chronic kidney disease   . Diabetes mellitus without complication ([place])   . Dysrhythmia   . Gout   . Hyperlipidemia   . Hypertension     Assessment/Plan: 2 Days Post-Op Procedure(s) (LRB): TOTAL HIP ARTHROPLASTY ANTERIOR APPROACH (Right) Active Problems:   S/P hip replacement  Estimated body mass index is 51.2 kg/m as calculated from the following:   Height as of this encounter: 6\' 2"  (1.88 m).   Weight as of this encounter: 180.9 kg. D/C IV fluids Discharge home with home health   Labs reviewed this AM, 10.6 this AM. Patient has cleared stairs with therapy. Plan for discharge home today after bowel movement.  DVT Prophylaxis - Xarelto, Foot Pumps and TED hose Weight-Bearing as tolerated to right leg  J. Cameron Proud, PA-C Orthopaedic Surgery 09/18/2019, 9:57 AM

## 2019-09-18 NOTE — Progress Notes (Signed)
PT Cancellation Note  Patient Details Name: Colton Choi MRN: [id number] DOB: 11/30/1965   Cancelled Treatment:     PT attempt this morning however pt politely refused. "I'm going home this morning and don't need another PT session prior to DC." Pt will have outpatient PT at DC and feels confident in his abilities to safely manage at home.    Willette Pa 09/18/2019, 1:00 PM

## 2019-09-18 NOTE — Discharge Summary (Signed)
Physician Discharge Summary  Patient ID: Colton Choi MRN: [id number] DOB/AGE: 1965-07-07 54 y.o.  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   Consultants (if any):   Discharged Condition: Improved  Hospital Course: AHKEEM Choi is an 54 y.o. male who was admitted 09/16/2019 with a diagnosis of primary osteoarthritis of the right hip and went to the operating room on 09/16/2019 and underwent the above named procedures.    Surgeries: Procedure(s): TOTAL HIP ARTHROPLASTY ANTERIOR APPROACH on 09/16/2019 Patient tolerated the surgery well. Taken to PACU where she was stabilized and then transferred to the orthopedic floor.  Patient remained on his 20mg  daily Xarelto for DVT prevention.. Foot pumps applied bilaterally at 80 mm. Heels elevated on bed with rolled towels. No evidence of DVT. Negative Homan. Physical therapy started on day #1 for gait training and transfer. OT started day #1 for ADL and assisted devices.  Patient's IV was removed on POD2.  Woundvac switched to Prevena  woundvac prior to discharge.  Implants: Medacta Masterloc 7 lateralized, 58 mm Mpact DM cup and liner with X XL metal 28 mm head  He was given perioperative antibiotics:  Anti-infectives (From admission, onward)   Start     Dose/Rate Route Frequency Ordered Stop   09/16/19 1400  ceFAZolin (ANCEF) 3 g in dextrose 5 % 50 mL IVPB        3 g 100 mL/hr over 30 Minutes Intravenous Every 6 hours 09/16/19 1244 09/16/19 2036   09/16/19 1230  ceFAZolin (ANCEF) 3 g in dextrose 5 % 50 mL IVPB  Status:  Discontinued        3 g 100 mL/hr over 30 Minutes Intravenous Every 6 hours 09/16/19 1202 09/16/19 1244   09/16/19 0600  ceFAZolin (ANCEF) 3 g in dextrose 5 % 50 mL IVPB        3 g 100 mL/hr over 30 Minutes Intravenous On call to O.R. 09/15/19 2142 09/16/19 0745    .  He was given sequential compression devices, early ambulation, and Xarelto for DVT prophylaxis.  He benefited maximally from the hospital stay and there were no complications.    Recent vital signs:  Vitals:   09/18/19 0008 09/18/19 0819  BP: (!) 106/42 (!) 123/51  Pulse: 66 72  Resp: 18 18  Temp: 98 F (36.7 C) 98.6 F (37 C)  SpO2: 95% 97%    Recent laboratory studies:  Lab Results  Component Value Date   HGB 10.6 (L) 09/18/2019   HGB 11.3 (L) 09/17/2019  HGB 13.9 09/08/2019   Lab Results  Component Value Date   WBC 7.3 09/18/2019   PLT 172 09/18/2019   Lab Results  Component Value Date   INR 1.4 (H) 09/08/2019   Lab Results  Component Value Date   NA 134 (L) 09/17/2019   K 4.2 09/17/2019   CL 101 09/17/2019   CO2 24 09/17/2019   BUN 11 09/17/2019   CREATININE 0.81 09/17/2019   GLUCOSE 214 (H) 09/17/2019    Discharge Medications:   Allergies as of 09/18/2019      Reactions   Sulfa Antibiotics Other (See Comments)   Childhood      Medication List    TAKE these medications   amLODipine 5 MG tablet Commonly known as: NORVASC Take 5 mg by mouth daily.   aspirin EC 81 MG tablet Take 81 mg by  mouth daily. Swallow whole.   atorvastatin 40 MG tablet Commonly known as: LIPITOR Take 40 mg by mouth daily.   glimepiride 2 MG tablet Commonly known as: AMARYL Take 2 mg by mouth daily with breakfast.   lisinopril-hydrochlorothiazide 20-12.5 MG tablet Commonly known as: ZESTORETIC Take 1 tablet by mouth daily.   metFORMIN 500 MG 24 hr tablet Commonly known as: GLUCOPHAGE-XR Take 500 mg by mouth in the morning and at bedtime.   metoprolol 200 MG 24 hr tablet Commonly known as: TOPROL-XL Take 200 mg by mouth every morning.   neomycin-bacitracin-polymyxin ointment Commonly known as: NEOSPORIN Apply 1 application topically daily as needed for wound care.   oxyCODONE 5 MG immediate release tablet Commonly known as: Oxy IR/ROXICODONE Take 1 tablet (5 mg total) by mouth every 4 (four) hours as needed for moderate pain (pain score 4-6).   rivaroxaban 20 MG Tabs tablet Commonly known as: XARELTO Take 20 mg by mouth daily with supper.   Rybelsus 7 MG Tabs Generic drug: Semaglutide Take 7 mg by mouth daily.   traMADol 50 MG tablet Commonly known as: ULTRAM Take 1 tablet (50 mg total) by mouth every 6 (six) hours.       Diagnostic Studies: DG HIP OPERATIVE UNILAT W OR W/O PELVIS RIGHT  Result Date: 09/16/2019 CLINICAL DATA:  Right hip arthroplasty EXAM: OPERATIVE RIGHT HIP (WITH PELVIS IF PERFORMED) AP VIEWS TECHNIQUE: Fluoroscopic spot image(s) were submitted for interpretation post-operatively. COMPARISON:  10/10/2014. FINDINGS: Two intraoperative fluoroscopic images from right total hip arthroplasty were submitted. Visualized portion of right hip arthroplasty hardware appears in its expected alignment without obvious complication. 36 seconds of fluoroscopy time was utilized. Please see performing surgeon's operative note for further detail. IMPRESSION: As above. Electronically Signed   By: Davina Poke D.O.   On: 09/16/2019 10:03   DG HIP UNILAT W OR W/O PELVIS 2-3 VIEWS  RIGHT  Result Date: 09/16/2019 CLINICAL DATA:  Postop right hip replacement EXAM: DG HIP (WITH OR WITHOUT PELVIS) 2-3V RIGHT COMPARISON:  Fluoroscopy from earlier today FINDINGS: Total right hip arthroplasty. No evidence of fracture or dislocation. IMPRESSION: Unremarkable right hip arthroplasty. Electronically Signed   By: Monte Fantasia M.D.   On: 09/16/2019 10:16   Disposition: Plan for discharge home today pending bowel movement.  Switch to Prevena woundvac cannister prior to discharge.   Follow-up Information    Duanne Guess, PA-C. Go in 2 week(s).   Specialties: Orthopedic Surgery, Emergency Medicine Why: For staple removal Contact information: [place] [address] [phone]  Signed: Judson Roch PA-C 09/18/2019, 10:02 AM

## 2019-09-18 NOTE — Progress Notes (Signed)
Discharge summary reviewed with verbal understanding. Answered all questions. Prevena switched by Dr. Rudene Christians. Escorted to personal vehicle via wc.

## 2019-09-20 LAB — SURGICAL PATHOLOGY

## 2019-09-20 NOTE — Anesthesia Postprocedure Evaluation (Signed)
Anesthesia Post Note  Patient: Colton Choi  Procedure(s) Performed: TOTAL HIP ARTHROPLASTY ANTERIOR APPROACH (Right Hip)  Patient location during evaluation: PACU Anesthesia Type: General Level of consciousness: awake and alert and oriented Pain management: pain level controlled Vital Signs Assessment: post-procedure vital signs reviewed and stable Respiratory status: spontaneous breathing Cardiovascular status: blood pressure returned to baseline Anesthetic complications: no   No complications documented.   Last Vitals:  Vitals:   09/18/19 0008 09/18/19 0819  BP: (!) 106/42 (!) 123/51  Pulse: 66 72  Resp: 18 18  Temp: 36.7 C 37 C  SpO2: 95% 97%    Last Pain:  Vitals:   09/18/19 0819  TempSrc: Oral  PainSc:                  Meriel Kelliher

## 2019-10-06 ENCOUNTER — Encounter: Payer: Self-pay | Admitting: Orthopedic Surgery

## 2020-11-20 IMAGING — XA DG HIP (WITH PELVIS) OPERATIVE*R*
1 series · 5 of 5 positions shown · non-contrast
Comparison: 10/10/2014.

CLINICAL DATA: Right hip arthroplasty

EXAM:
OPERATIVE RIGHT HIP (WITH PELVIS IF PERFORMED) AP VIEWS
TECHNIQUE: Fluoroscopic spot image(s) were submitted for interpretation
post-operatively.

[Series 15: ortho standard · 2 acquisitions, 5 frames shown]
[im 1/2]
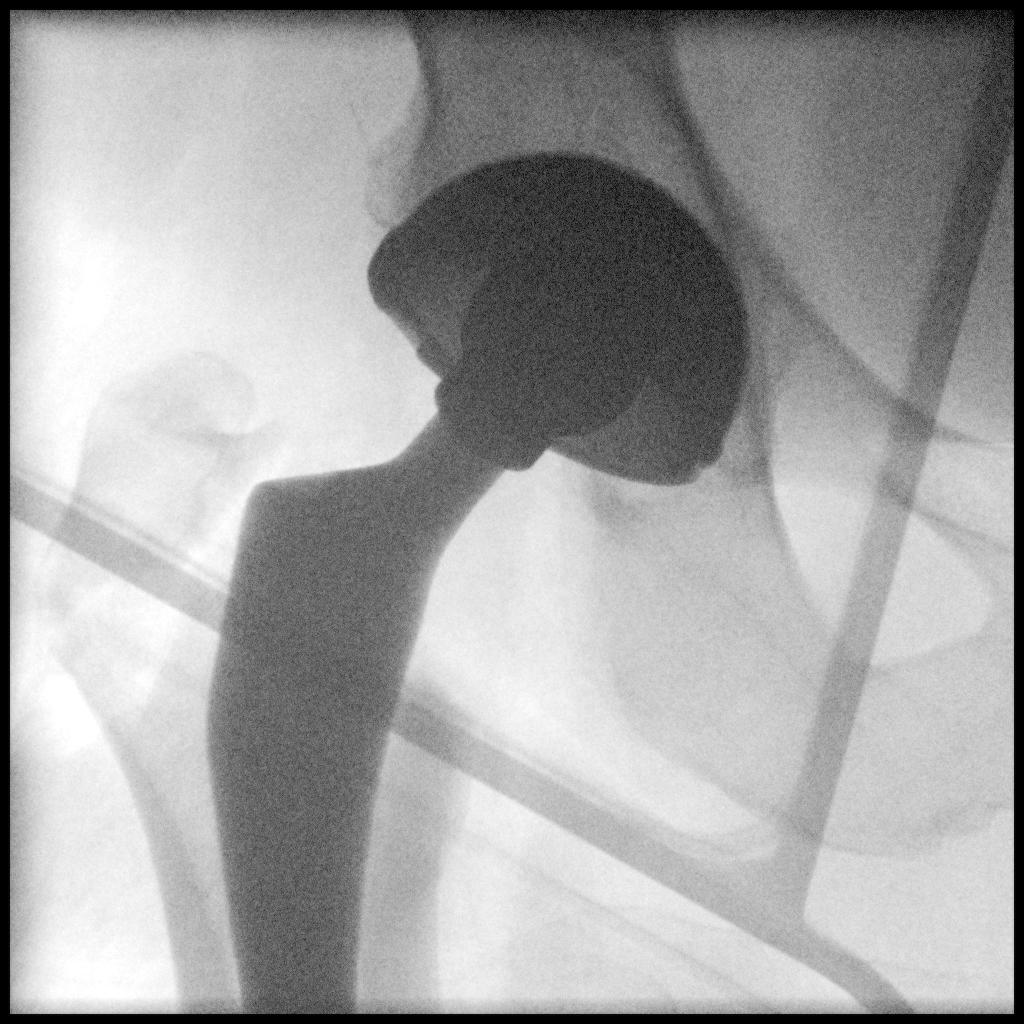
[im 1/2]
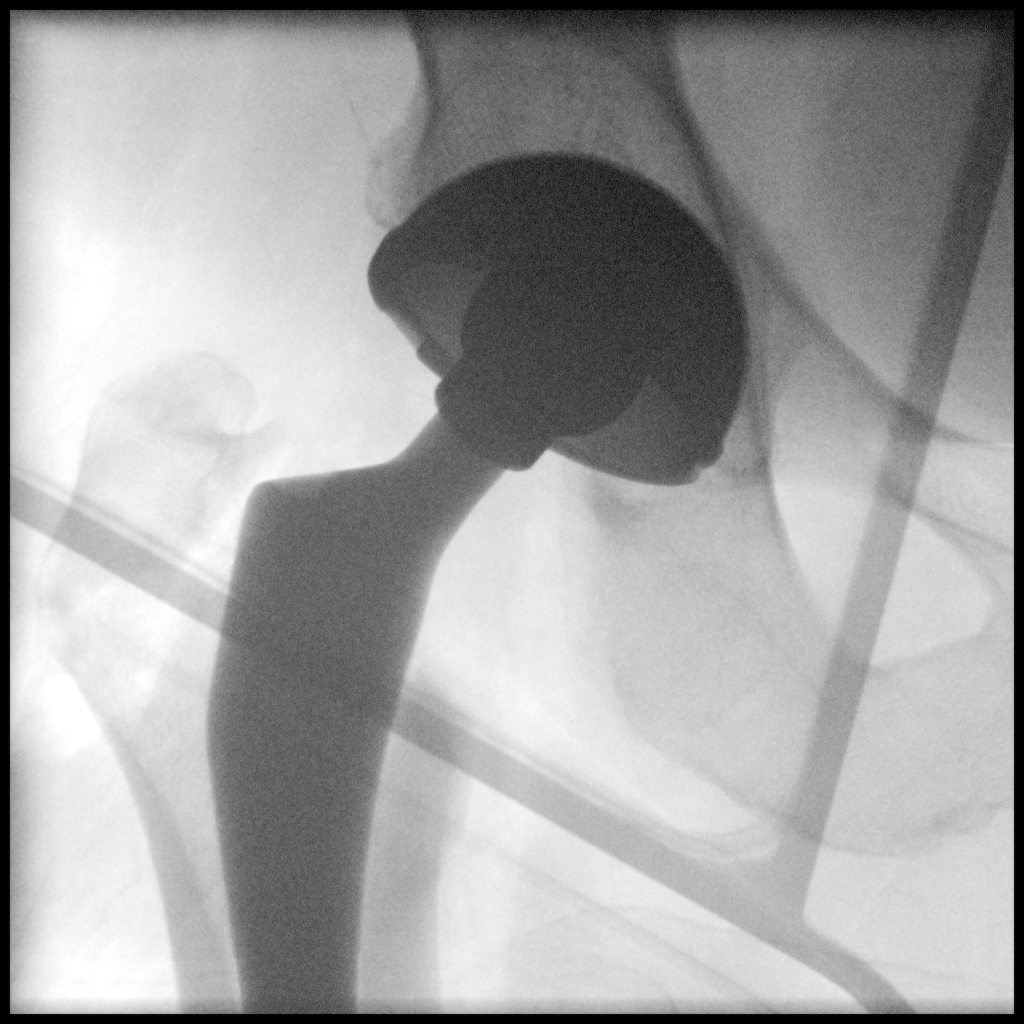
[im 1/2]
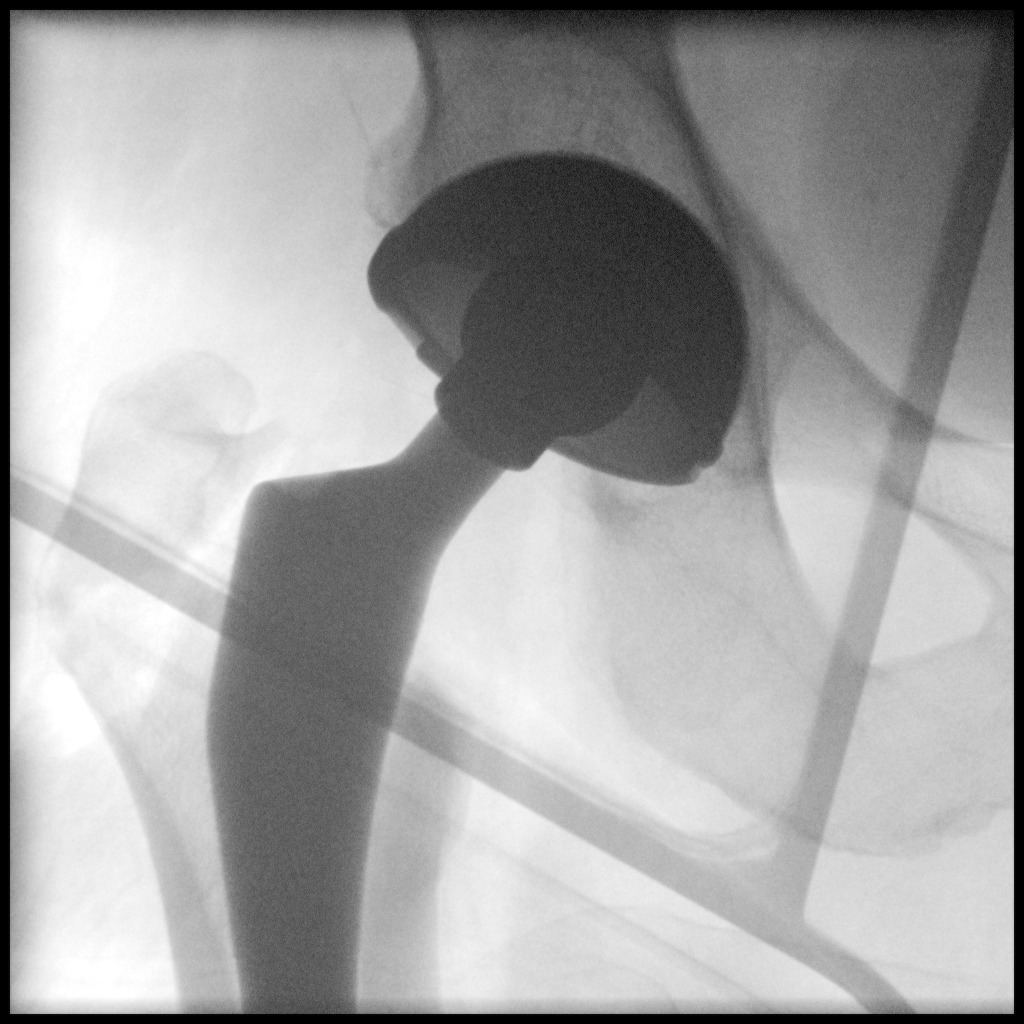
[im 1/2]
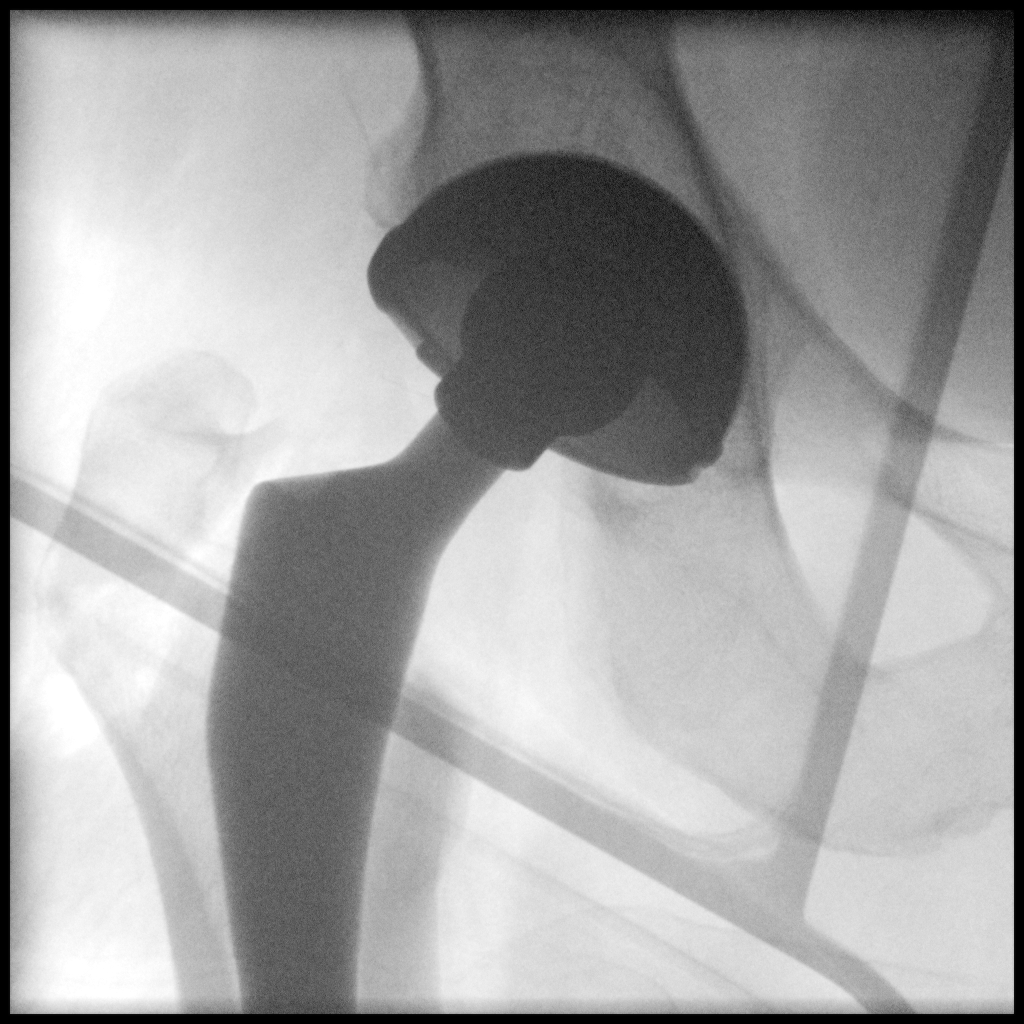
[im 2/2]
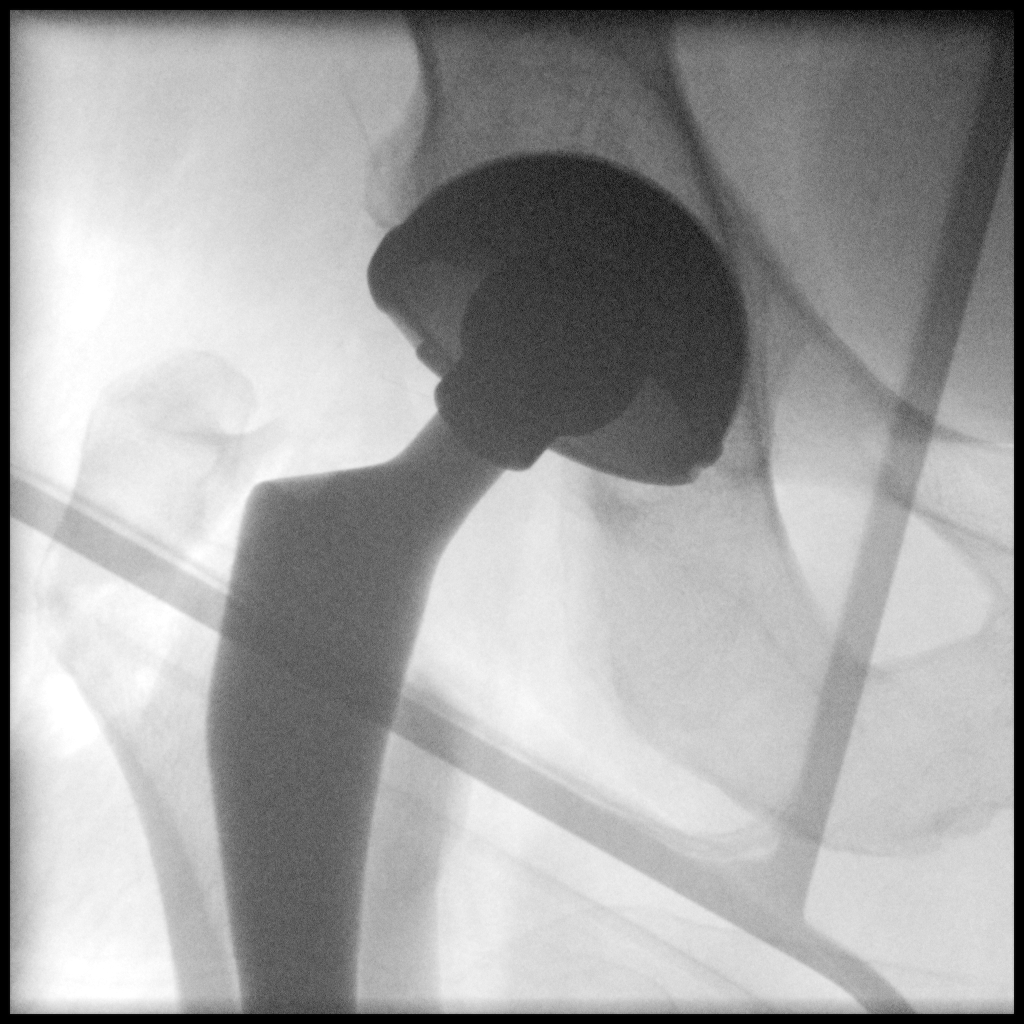

[5 of 5 positions shown; findings below may reference images not displayed]

FINDINGS: Two intraoperative fluoroscopic images from right total hip
arthroplasty were submitted. Visualized portion of right hip
arthroplasty hardware appears in its expected alignment without
obvious complication. 36 seconds of fluoroscopy time was utilized.
Please see performing surgeon's operative note for further detail.
IMPRESSION: As above.

## 2020-11-20 IMAGING — DX DG HIP (WITH OR WITHOUT PELVIS) 2-3V*R*
2 series · 2 of 2 positions shown · non-contrast
Comparison: Fluoroscopy from earlier today

CLINICAL DATA: Postop right hip replacement

EXAM:
DG HIP (WITH OR WITHOUT PELVIS) 2-3V RIGHT

[pelvis ap]
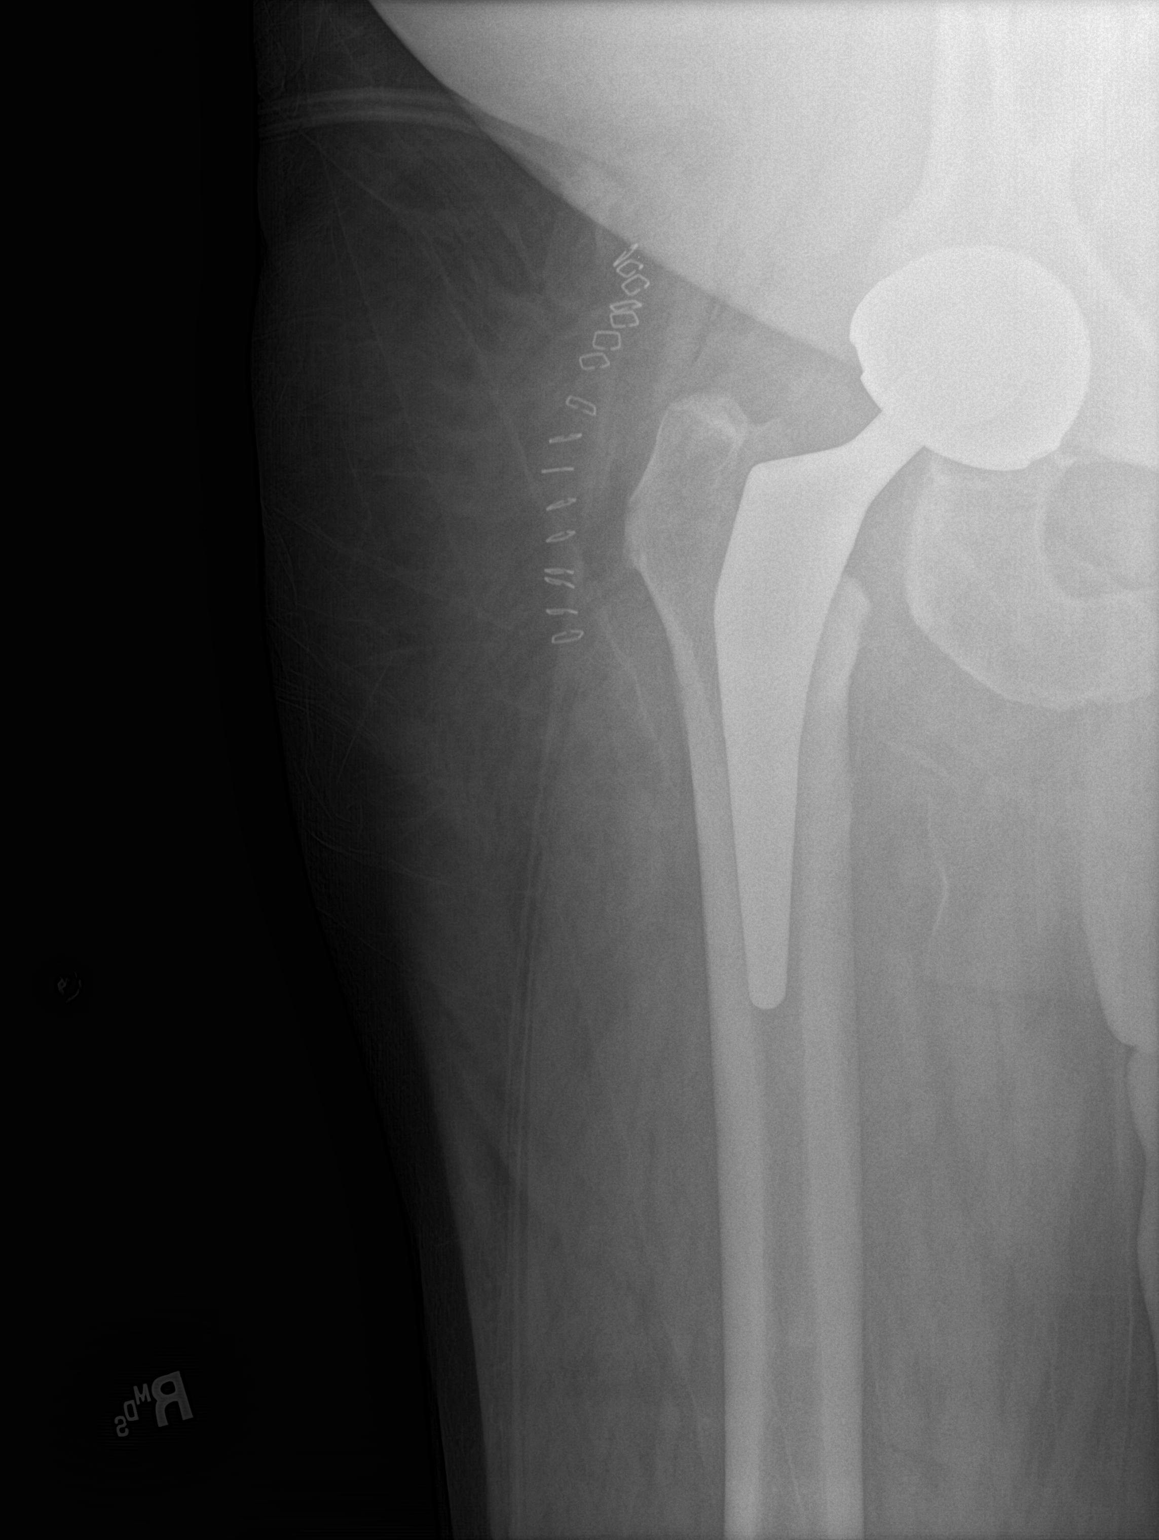

[hip lat]
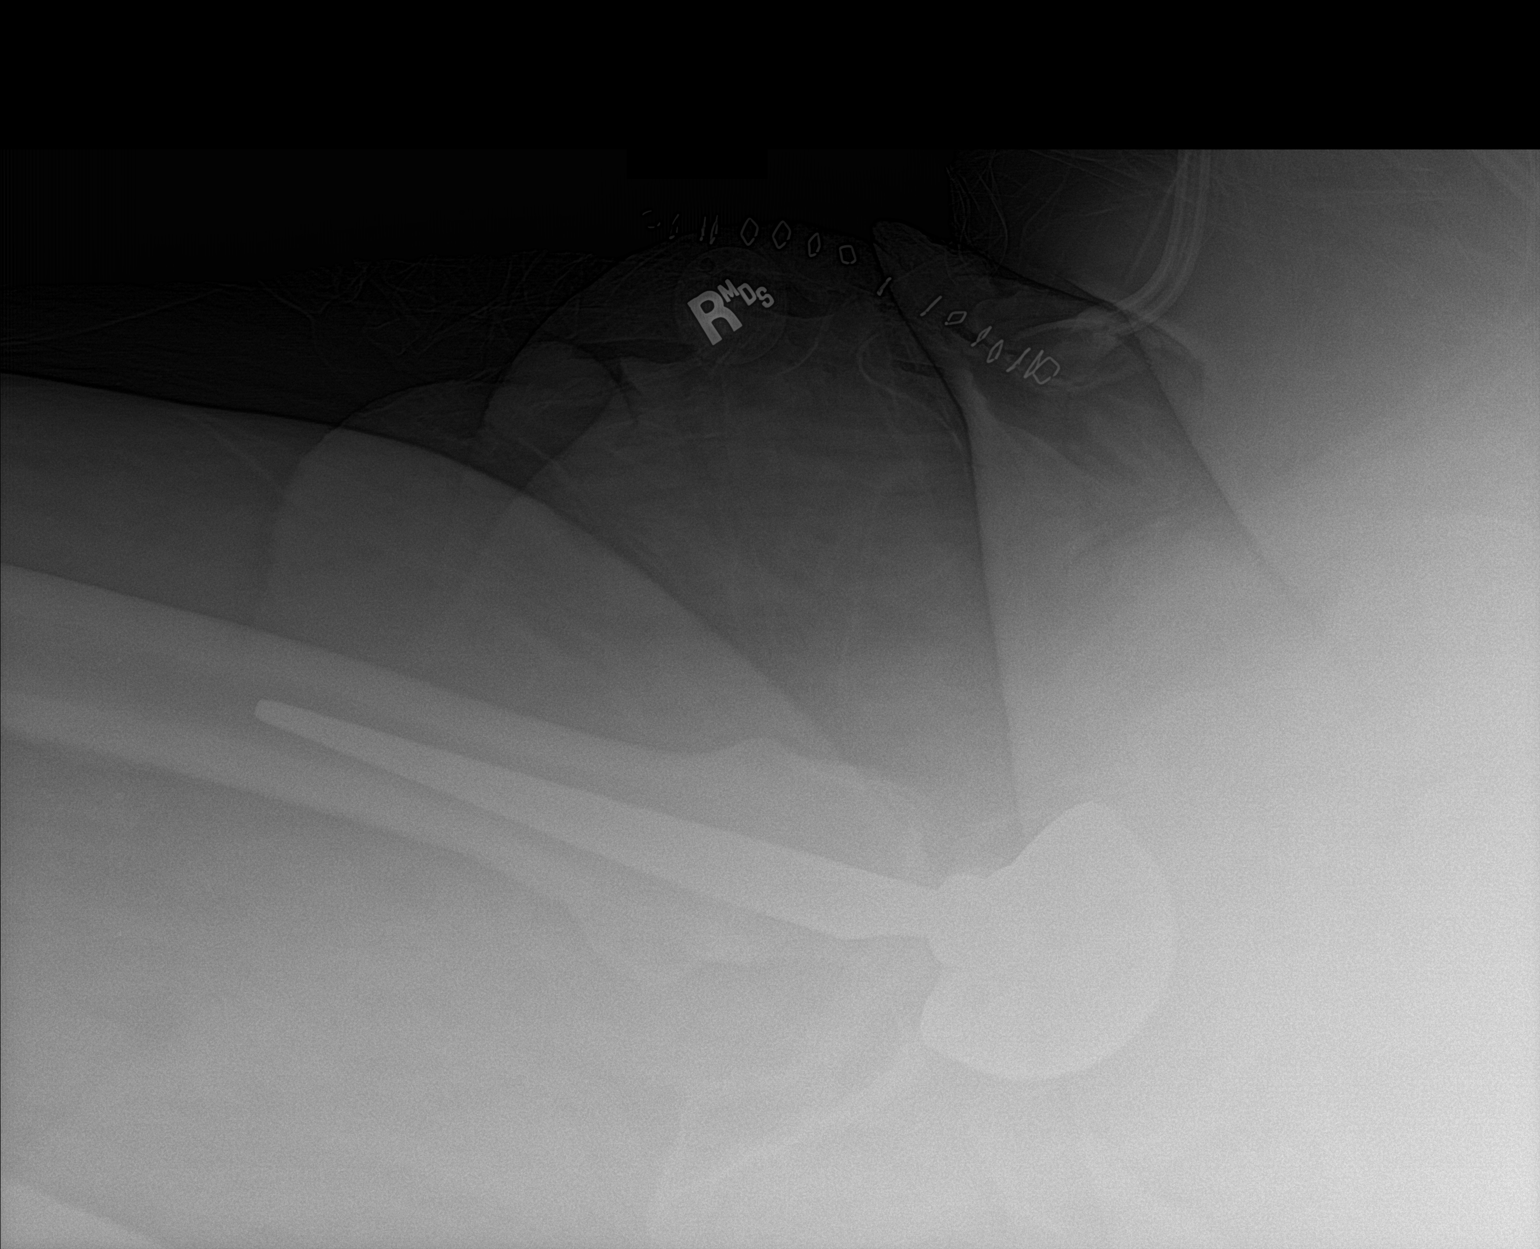

[2 of 2 positions shown; findings below may reference images not displayed]

FINDINGS: Total right hip arthroplasty. No evidence of fracture or
dislocation.
IMPRESSION: Unremarkable right hip arthroplasty.
